# Patient Record
Sex: Female | Born: 1990
Health system: Southern US, Community
[De-identification: ages and names within clinical notes are randomized; demographics above are authoritative.]

## PROBLEM LIST (undated history)

## (undated) DIAGNOSIS — F988 Other specified behavioral and emotional disorders with onset usually occurring in childhood and adolescence: Secondary | ICD-10-CM

## (undated) DIAGNOSIS — F419 Anxiety disorder, unspecified: Secondary | ICD-10-CM

## (undated) DIAGNOSIS — O24419 Gestational diabetes mellitus in pregnancy, unspecified control: Secondary | ICD-10-CM

## (undated) DIAGNOSIS — J45990 Exercise induced bronchospasm: Secondary | ICD-10-CM

## (undated) HISTORY — DX: Exercise induced bronchospasm: J45.990

## (undated) HISTORY — DX: Other specified behavioral and emotional disorders with onset usually occurring in childhood and adolescence: F98.8

## (undated) HISTORY — DX: Gestational diabetes mellitus in pregnancy, unspecified control: O24.419

---
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
## 2015-03-04 LAB — HM PAP SMEAR: HM Pap smear: NORMAL

## 2016-01-21 ENCOUNTER — Ambulatory Visit (INDEPENDENT_AMBULATORY_CARE_PROVIDER_SITE_OTHER): Payer: 59 | Admitting: Internal Medicine

## 2016-01-21 ENCOUNTER — Encounter: Payer: Self-pay | Admitting: Internal Medicine

## 2016-01-21 ENCOUNTER — Encounter: Payer: Self-pay | Admitting: *Deleted

## 2016-01-21 VITALS — BP 108/66 | HR 71 | Temp 99.1°F | Ht 65.5 in | Wt 144.4 lb

## 2016-01-21 DIAGNOSIS — F909 Attention-deficit hyperactivity disorder, unspecified type: Secondary | ICD-10-CM | POA: Diagnosis not present

## 2016-01-21 DIAGNOSIS — J45990 Exercise induced bronchospasm: Secondary | ICD-10-CM | POA: Diagnosis not present

## 2016-01-21 DIAGNOSIS — F988 Other specified behavioral and emotional disorders with onset usually occurring in childhood and adolescence: Secondary | ICD-10-CM | POA: Insufficient documentation

## 2016-01-21 DIAGNOSIS — Z23 Encounter for immunization: Secondary | ICD-10-CM | POA: Diagnosis not present

## 2016-01-21 DIAGNOSIS — Z0000 Encounter for general adult medical examination without abnormal findings: Secondary | ICD-10-CM

## 2016-01-21 NOTE — Patient Instructions (Addendum)
Will notify you  of labs when available. If all well then  wellness visit in 2 years ( see your gyne yearly)   Or as needed. Sign up for my chart .  uis helpful for communications Second varicella  Injection today  Healthy lifestyle includes : At least 150 minutes of exercise weeks  , weight at healthy levels, which is usually   BMI 19-25. Avoid trans fats and processed foods;  Increase fresh fruits and veges to 5 servings per day. And avoid sweet beverages including tea and juice. Mediterranean diet with olive oil and nuts have been noted to be heart and brain healthy . Avoid tobacco products . Limit  alcohol to  7 per week for women and 14 servings for men.  Get adequate sleep . Wear seat belts . Don't text and drive .   Preventive Care for Adults, Female A healthy lifestyle and preventive care can promote health and wellness. Preventive health guidelines for women include the following key practices.  A routine yearly physical is a good way to check with your health care provider about your health and preventive screening. It is a chance to share any concerns and updates on your health and to receive a thorough exam.  Visit your dentist for a routine exam and preventive care every 6 months. Brush your teeth twice a day and floss once a day. Good oral hygiene prevents tooth decay and gum disease.  The frequency of eye exams is based on your age, health, family medical history, use of contact lenses, and other factors. Follow your health care provider's recommendations for frequency of eye exams.  Eat a healthy diet. Foods like vegetables, fruits, whole grains, low-fat dairy products, and lean protein foods contain the nutrients you need without too many calories. Decrease your intake of foods high in solid fats, added sugars, and salt. Eat the right amount of calories for you.Get information about a proper diet from your health care provider, if necessary.  Regular physical exercise is  one of the most important things you can do for your health. Most adults should get at least 150 minutes of moderate-intensity exercise (any activity that increases your heart rate and causes you to sweat) each week. In addition, most adults need muscle-strengthening exercises on 2 or more days a week.  Maintain a healthy weight. The body mass index (BMI) is a screening tool to identify possible weight problems. It provides an estimate of body fat based on height and weight. Your health care provider can find your BMI and can help you achieve or maintain a healthy weight.For adults 20 years and older:  A BMI below 18.5 is considered underweight.  A BMI of 18.5 to 24.9 is normal.  A BMI of 25 to 29.9 is considered overweight.  A BMI of 30 and above is considered obese.  Maintain normal blood lipids and cholesterol levels by exercising and minimizing your intake of saturated fat. Eat a balanced diet with plenty of fruit and vegetables. Blood tests for lipids and cholesterol should begin at age 33 and be repeated every 5 years. If your lipid or cholesterol levels are high, you are over 50, or you are at high risk for heart disease, you may need your cholesterol levels checked more frequently.Ongoing high lipid and cholesterol levels should be treated with medicines if diet and exercise are not working.  If you smoke, find out from your health care provider how to quit. If you do not use tobacco, do  not start.  Lung cancer screening is recommended for adults aged 66-80 years who are at high risk for developing lung cancer because of a history of smoking. A yearly low-dose CT scan of the lungs is recommended for people who have at least a 30-pack-year history of smoking and are a current smoker or have quit within the past 15 years. A pack year of smoking is smoking an average of 1 pack of cigarettes a day for 1 year (for example: 1 pack a day for 30 years or 2 packs a day for 15 years). Yearly  screening should continue until the smoker has stopped smoking for at least 15 years. Yearly screening should be stopped for people who develop a health problem that would prevent them from having lung cancer treatment.  If you are pregnant, do not drink alcohol. If you are breastfeeding, be very cautious about drinking alcohol. If you are not pregnant and choose to drink alcohol, do not have more than 1 drink per day. One drink is considered to be 12 ounces (355 mL) of beer, 5 ounces (148 mL) of wine, or 1.5 ounces (44 mL) of liquor.  Avoid use of street drugs. Do not share needles with anyone. Ask for help if you need support or instructions about stopping the use of drugs.  High blood pressure causes heart disease and increases the risk of stroke. Your blood pressure should be checked at least every 1 to 2 years. Ongoing high blood pressure should be treated with medicines if weight loss and exercise do not work.  If you are 2-48 years old, ask your health care provider if you should take aspirin to prevent strokes.  Diabetes screening is done by taking a blood sample to check your blood glucose level after you have not eaten for a certain period of time (fasting). If you are not overweight and you do not have risk factors for diabetes, you should be screened once every 3 years starting at age 1. If you are overweight or obese and you are 55-11 years of age, you should be screened for diabetes every year as part of your cardiovascular risk assessment.  Breast cancer screening is essential preventive care for women. You should practice "breast self-awareness." This means understanding the normal appearance and feel of your breasts and may include breast self-examination. Any changes detected, no matter how small, should be reported to a health care provider. Women in their 75s and 30s should have a clinical breast exam (CBE) by a health care provider as part of a regular health exam every 1 to 3  years. After age 29, women should have a CBE every year. Starting at age 29, women should consider having a mammogram (breast X-ray test) every year. Women who have a family history of breast cancer should talk to their health care provider about genetic screening. Women at a high risk of breast cancer should talk to their health care providers about having an MRI and a mammogram every year.  Breast cancer gene (BRCA)-related cancer risk assessment is recommended for women who have family members with BRCA-related cancers. BRCA-related cancers include breast, ovarian, tubal, and peritoneal cancers. Having family members with these cancers may be associated with an increased risk for harmful changes (mutations) in the breast cancer genes BRCA1 and BRCA2. Results of the assessment will determine the need for genetic counseling and BRCA1 and BRCA2 testing.  Your health care provider may recommend that you be screened regularly for cancer of the  pelvic organs (ovaries, uterus, and vagina). This screening involves a pelvic examination, including checking for microscopic changes to the surface of your cervix (Pap test). You may be encouraged to have this screening done every 3 years, beginning at age 60.  For women ages 82-65, health care providers may recommend pelvic exams and Pap testing every 3 years, or they may recommend the Pap and pelvic exam, combined with testing for human papilloma virus (HPV), every 5 years. Some types of HPV increase your risk of cervical cancer. Testing for HPV may also be done on women of any age with unclear Pap test results.  Other health care providers may not recommend any screening for nonpregnant women who are considered low risk for pelvic cancer and who do not have symptoms. Ask your health care provider if a screening pelvic exam is right for you.  If you have had past treatment for cervical cancer or a condition that could lead to cancer, you need Pap tests and screening  for cancer for at least 20 years after your treatment. If Pap tests have been discontinued, your risk factors (such as having a new sexual partner) need to be reassessed to determine if screening should resume. Some women have medical problems that increase the chance of getting cervical cancer. In these cases, your health care provider may recommend more frequent screening and Pap tests.  Colorectal cancer can be detected and often prevented. Most routine colorectal cancer screening begins at the age of 34 years and continues through age 34 years. However, your health care provider may recommend screening at an earlier age if you have risk factors for colon cancer. On a yearly basis, your health care provider may provide home test kits to check for hidden blood in the stool. Use of a small camera at the end of a tube, to directly examine the colon (sigmoidoscopy or colonoscopy), can detect the earliest forms of colorectal cancer. Talk to your health care provider about this at age 77, when routine screening begins. Direct exam of the colon should be repeated every 5-10 years through age 94 years, unless early forms of precancerous polyps or small growths are found.  People who are at an increased risk for hepatitis B should be screened for this virus. You are considered at high risk for hepatitis B if:  You were born in a country where hepatitis B occurs often. Talk with your health care provider about which countries are considered high risk.  Your parents were born in a high-risk country and you have not received a shot to protect against hepatitis B (hepatitis B vaccine).  You have HIV or AIDS.  You use needles to inject street drugs.  You live with, or have sex with, someone who has hepatitis B.  You get hemodialysis treatment.  You take certain medicines for conditions like cancer, organ transplantation, and autoimmune conditions.  Hepatitis C blood testing is recommended for all people  born from [birth year] through 1964 and any individual with known risks for hepatitis C.  Practice safe sex. Use condoms and avoid high-risk sexual practices to reduce the spread of sexually transmitted infections (STIs). STIs include gonorrhea, chlamydia, syphilis, trichomonas, herpes, HPV, and human immunodeficiency virus (HIV). Herpes, HIV, and HPV are viral illnesses that have no cure. They can result in disability, cancer, and death.  You should be screened for sexually transmitted illnesses (STIs) including gonorrhea and chlamydia if:  You are sexually active and are younger than 24 years.  You are  older than 24 years and your health care provider tells you that you are at risk for this type of infection.  Your sexual activity has changed since you were last screened and you are at an increased risk for chlamydia or gonorrhea. Ask your health care provider if you are at risk.  If you are at risk of being infected with HIV, it is recommended that you take a prescription medicine daily to prevent HIV infection. This is called preexposure prophylaxis (PrEP). You are considered at risk if:  You are sexually active and do not regularly use condoms or know the HIV status of your partner(s).  You take drugs by injection.  You are sexually active with a partner who has HIV.  Talk with your health care provider about whether you are at high risk of being infected with HIV. If you choose to begin PrEP, you should first be tested for HIV. You should then be tested every 3 months for as long as you are taking PrEP.  Osteoporosis is a disease in which the bones lose minerals and strength with aging. This can result in serious bone fractures or breaks. The risk of osteoporosis can be identified using a bone density scan. Women ages 61 years and over and women at risk for fractures or osteoporosis should discuss screening with their health care providers. Ask your health care provider whether you should take a  calcium supplement or vitamin D to reduce the rate of osteoporosis.  Menopause can be associated with physical symptoms and risks. Hormone replacement therapy is available to decrease symptoms and risks. You should talk to your health care provider about whether hormone replacement therapy is right for you.  Use sunscreen. Apply sunscreen liberally and repeatedly throughout the day. You should seek shade when your shadow is shorter than you. Protect yourself by wearing long sleeves, pants, a wide-brimmed hat, and sunglasses year round, whenever you are outdoors.  Once a month, do a whole body skin exam, using a mirror to look at the skin on your back. Tell your health care provider of new moles, moles that have irregular borders, moles that are larger than a pencil eraser, or moles that have changed in shape or color.  Stay current with required vaccines (immunizations).  Influenza vaccine. All adults should be immunized every year.  Tetanus, diphtheria, and acellular pertussis (Td, Tdap) vaccine. Pregnant women should receive 1 dose of Tdap vaccine during each pregnancy. The dose should be obtained regardless of the length of time since the last dose. Immunization is preferred during the 27th-36th week of gestation. An adult who has not previously received Tdap or who does not know her vaccine status should receive 1 dose of Tdap. This initial dose should be followed by tetanus and diphtheria toxoids (Td) booster doses every 10 years. Adults with an unknown or incomplete history of completing a 3-dose immunization series with Td-containing vaccines should begin or complete a primary immunization series including a Tdap dose. Adults should receive a Td booster every 10 years.  Varicella vaccine. An adult without evidence of immunity to varicella should receive 2 doses or a second dose if she has previously received 1 dose. Pregnant females who do not have evidence of immunity should receive the first  dose after pregnancy. This first dose should be obtained before leaving the health care facility. The second dose should be obtained 4-8 weeks after the first dose.  Human papillomavirus (HPV) vaccine. Females aged 13-26 years who have not received  the vaccine previously should obtain the 3-dose series. The vaccine is not recommended for use in pregnant females. However, pregnancy testing is not needed before receiving a dose. If a female is found to be pregnant after receiving a dose, no treatment is needed. In that case, the remaining doses should be delayed until after the pregnancy. Immunization is recommended for any person with an immunocompromised condition through the age of 46 years if she did not get any or all doses earlier. During the 3-dose series, the second dose should be obtained 4-8 weeks after the first dose. The third dose should be obtained 24 weeks after the first dose and 16 weeks after the second dose.  Zoster vaccine. One dose is recommended for adults aged 18 years or older unless certain conditions are present.  Measles, mumps, and rubella (MMR) vaccine. Adults born before [birth year] generally are considered immune to measles and mumps. Adults born in [birth year] or later should have 1 or more doses of MMR vaccine unless there is a contraindication to the vaccine or there is laboratory evidence of immunity to each of the three diseases. A routine second dose of MMR vaccine should be obtained at least 28 days after the first dose for students attending postsecondary schools, health care workers, or international travelers. People who received inactivated measles vaccine or an unknown type of measles vaccine during 1963-1967 should receive 2 doses of MMR vaccine. People who received inactivated mumps vaccine or an unknown type of mumps vaccine before 1979 and are at high risk for mumps infection should consider immunization with 2 doses of MMR vaccine. For females of childbearing age, rubella  immunity should be determined. If there is no evidence of immunity, females who are not pregnant should be vaccinated. If there is no evidence of immunity, females who are pregnant should delay immunization until after pregnancy. Unvaccinated health care workers born before 65 who lack laboratory evidence of measles, mumps, or rubella immunity or laboratory confirmation of disease should consider measles and mumps immunization with 2 doses of MMR vaccine or rubella immunization with 1 dose of MMR vaccine.  Pneumococcal 13-valent conjugate (PCV13) vaccine. When indicated, a person who is uncertain of his immunization history and has no record of immunization should receive the PCV13 vaccine. All adults 73 years of age and older should receive this vaccine. An adult aged 64 years or older who has certain medical conditions and has not been previously immunized should receive 1 dose of PCV13 vaccine. This PCV13 should be followed with a dose of pneumococcal polysaccharide (PPSV23) vaccine. Adults who are at high risk for pneumococcal disease should obtain the PPSV23 vaccine at least 8 weeks after the dose of PCV13 vaccine. Adults older than 24 years of age who have normal immune system function should obtain the PPSV23 vaccine dose at least 1 year after the dose of PCV13 vaccine.  Pneumococcal polysaccharide (PPSV23) vaccine. When PCV13 is also indicated, PCV13 should be obtained first. All adults aged 29 years and older should be immunized. An adult younger than age 70 years who has certain medical conditions should be immunized. Any person who resides in a nursing home or long-term care facility should be immunized. An adult smoker should be immunized. People with an immunocompromised condition and certain other conditions should receive both PCV13 and PPSV23 vaccines. People with human immunodeficiency virus (HIV) infection should be immunized as soon as possible after diagnosis. Immunization during  chemotherapy or radiation therapy should be avoided. Routine use of PPSV23 vaccine  is not recommended for American Indians, [place] Natives, or people younger than 27 years unless there are medical conditions that require PPSV23 vaccine. When indicated, people who have unknown immunization and have no record of immunization should receive PPSV23 vaccine. One-time revaccination 5 years after the first dose of PPSV23 is recommended for people aged 19-64 years who have chronic kidney failure, nephrotic syndrome, asplenia, or immunocompromised conditions. People who received 1-2 doses of PPSV23 before age 50 years should receive another dose of PPSV23 vaccine at age 6 years or later if at least 5 years have passed since the previous dose. Doses of PPSV23 are not needed for people immunized with PPSV23 at or after age 40 years.  Meningococcal vaccine. Adults with asplenia or persistent complement component deficiencies should receive 2 doses of quadrivalent meningococcal conjugate (MenACWY-D) vaccine. The doses should be obtained at least 2 months apart. Microbiologists working with certain meningococcal bacteria, [place] recruits, people at risk during an outbreak, and people who travel to or live in countries with a high rate of meningitis should be immunized. A first-year college student up through age 67 years who is living in a residence hall should receive a dose if she did not receive a dose on or after her 16th birthday. Adults who have certain high-risk conditions should receive one or more doses of vaccine.  Hepatitis A vaccine. Adults who wish to be protected from this disease, have certain high-risk conditions, work with hepatitis A-infected animals, work in hepatitis A research labs, or travel to or work in countries with a high rate of hepatitis A should be immunized. Adults who were previously unvaccinated and who anticipate close contact with an international adoptee during the first 60 days after  arrival in the [place] from a country with a high rate of hepatitis A should be immunized.  Hepatitis B vaccine. Adults who wish to be protected from this disease, have certain high-risk conditions, may be exposed to blood or other infectious body fluids, are household contacts or sex partners of hepatitis B positive people, are clients or workers in certain care facilities, or travel to or work in countries with a high rate of hepatitis B should be immunized.  Haemophilus influenzae type b (Hib) vaccine. A previously unvaccinated person with asplenia or sickle cell disease or having a scheduled splenectomy should receive 1 dose of Hib vaccine. Regardless of previous immunization, a recipient of a hematopoietic stem cell transplant should receive a 3-dose series 6-12 months after her successful transplant. Hib vaccine is not recommended for adults with HIV infection. Preventive Services / Frequency Ages 44 to 37 years  Blood pressure check.** / Every 3-5 years.  Lipid and cholesterol check.** / Every 5 years beginning at age 77.  Clinical breast exam.** / Every 3 years for women in their 44s and 80s.  BRCA-related cancer risk assessment.** / For women who have family members with a BRCA-related cancer (breast, ovarian, tubal, or peritoneal cancers).  Pap test.** / Every 2 years from ages 65 through 51. Every 3 years starting at age 68 through age 61 or 29 with a history of 3 consecutive normal Pap tests.  HPV screening.** / Every 3 years from ages 4 through ages 42 to 43 with a history of 3 consecutive normal Pap tests.  Hepatitis C blood test.** / For any individual with known risks for hepatitis C.  Skin self-exam. / Monthly.  Influenza vaccine. / Every year.  Tetanus, diphtheria, and acellular pertussis (Tdap, Td) vaccine.** / Consult  your health care provider. Pregnant women should receive 1 dose of Tdap vaccine during each pregnancy. 1 dose of Td every 10 years.  Varicella  vaccine.** / Consult your health care provider. Pregnant females who do not have evidence of immunity should receive the first dose after pregnancy.  HPV vaccine. / 3 doses over 6 months, if 32 and younger. The vaccine is not recommended for use in pregnant females. However, pregnancy testing is not needed before receiving a dose.  Measles, mumps, rubella (MMR) vaccine.** / You need at least 1 dose of MMR if you were born in 1957 or later. You may also need a 2nd dose. For females of childbearing age, rubella immunity should be determined. If there is no evidence of immunity, females who are not pregnant should be vaccinated. If there is no evidence of immunity, females who are pregnant should delay immunization until after pregnancy.  Pneumococcal 13-valent conjugate (PCV13) vaccine.** / Consult your health care provider.  Pneumococcal polysaccharide (PPSV23) vaccine.** / 1 to 2 doses if you smoke cigarettes or if you have certain conditions.  Meningococcal vaccine.** / 1 dose if you are age 74 to 25 years and a Market researcher living in a residence hall, or have one of several medical conditions, you need to get vaccinated against meningococcal disease. You may also need additional booster doses.  Hepatitis A vaccine.** / Consult your health care provider.  Hepatitis B vaccine.** / Consult your health care provider.  Haemophilus influenzae type b (Hib) vaccine.** / Consult your health care provider. Ages 91 to 18 years  Blood pressure check.** / Every year.  Lipid and cholesterol check.** / Every 5 years beginning at age 75 years.  Lung cancer screening. / Every year if you are aged 2-80 years and have a 30-pack-year history of smoking and currently smoke or have quit within the past 15 years. Yearly screening is stopped once you have quit smoking for at least 15 years or develop a health problem that would prevent you from having lung cancer treatment.  Clinical breast exam.**  / Every year after age 70 years.  BRCA-related cancer risk assessment.** / For women who have family members with a BRCA-related cancer (breast, ovarian, tubal, or peritoneal cancers).  Mammogram.** / Every year beginning at age 63 years and continuing for as long as you are in good health. Consult with your health care provider.  Pap test.** / Every 3 years starting at age 45 years through age 69 or 27 years with a history of 3 consecutive normal Pap tests.  HPV screening.** / Every 3 years from ages 69 years through ages 55 to 109 years with a history of 3 consecutive normal Pap tests.  Fecal occult blood test (FOBT) of stool. / Every year beginning at age 87 years and continuing until age 65 years. You may not need to do this test if you get a colonoscopy every 10 years.  Flexible sigmoidoscopy or colonoscopy.** / Every 5 years for a flexible sigmoidoscopy or every 10 years for a colonoscopy beginning at age 34 years and continuing until age 58 years.  Hepatitis C blood test.** / For all people born from [birth year] through 1964 and any individual with known risks for hepatitis C.  Skin self-exam. / Monthly.  Influenza vaccine. / Every year.  Tetanus, diphtheria, and acellular pertussis (Tdap/Td) vaccine.** / Consult your health care provider. Pregnant women should receive 1 dose of Tdap vaccine during each pregnancy. 1 dose of Td every 10 years.  Varicella vaccine.** / Consult your health care provider. Pregnant females who do not have evidence of immunity should receive the first dose after pregnancy.  Zoster vaccine.** / 1 dose for adults aged 68 years or older.  Measles, mumps, rubella (MMR) vaccine.** / You need at least 1 dose of MMR if you were born in 1957 or later. You may also need a second dose. For females of childbearing age, rubella immunity should be determined. If there is no evidence of immunity, females who are not pregnant should be vaccinated. If there is no evidence of  immunity, females who are pregnant should delay immunization until after pregnancy.  Pneumococcal 13-valent conjugate (PCV13) vaccine.** / Consult your health care provider.  Pneumococcal polysaccharide (PPSV23) vaccine.** / 1 to 2 doses if you smoke cigarettes or if you have certain conditions.  Meningococcal vaccine.** / Consult your health care provider.  Hepatitis A vaccine.** / Consult your health care provider.  Hepatitis B vaccine.** / Consult your health care provider.  Haemophilus influenzae type b (Hib) vaccine.** / Consult your health care provider. Ages 16 years and over  Blood pressure check.** / Every year.  Lipid and cholesterol check.** / Every 5 years beginning at age 50 years.  Lung cancer screening. / Every year if you are aged 10-80 years and have a 30-pack-year history of smoking and currently smoke or have quit within the past 15 years. Yearly screening is stopped once you have quit smoking for at least 15 years or develop a health problem that would prevent you from having lung cancer treatment.  Clinical breast exam.** / Every year after age 61 years.  BRCA-related cancer risk assessment.** / For women who have family members with a BRCA-related cancer (breast, ovarian, tubal, or peritoneal cancers).  Mammogram.** / Every year beginning at age 91 years and continuing for as long as you are in good health. Consult with your health care provider.  Pap test.** / Every 3 years starting at age 38 years through age 24 or 37 years with 3 consecutive normal Pap tests. Testing can be stopped between 65 and 70 years with 3 consecutive normal Pap tests and no abnormal Pap or HPV tests in the past 10 years.  HPV screening.** / Every 3 years from ages 65 years through ages 81 or 69 years with a history of 3 consecutive normal Pap tests. Testing can be stopped between 65 and 70 years with 3 consecutive normal Pap tests and no abnormal Pap or HPV tests in the past 10  years.  Fecal occult blood test (FOBT) of stool. / Every year beginning at age 29 years and continuing until age 27 years. You may not need to do this test if you get a colonoscopy every 10 years.  Flexible sigmoidoscopy or colonoscopy.** / Every 5 years for a flexible sigmoidoscopy or every 10 years for a colonoscopy beginning at age 61 years and continuing until age 76 years.  Hepatitis C blood test.** / For all people born from [birth year] through 1964 and any individual with known risks for hepatitis C.  Osteoporosis screening.** / A one-time screening for women ages 50 years and over and women at risk for fractures or osteoporosis.  Skin self-exam. / Monthly.  Influenza vaccine. / Every year.  Tetanus, diphtheria, and acellular pertussis (Tdap/Td) vaccine.** / 1 dose of Td every 10 years.  Varicella vaccine.** / Consult your health care provider.  Zoster vaccine.** / 1 dose for adults aged 6 years or older.  Pneumococcal 13-valent  conjugate (PCV13) vaccine.** / Consult your health care provider.  Pneumococcal polysaccharide (PPSV23) vaccine.** / 1 dose for all adults aged 50 years and older.  Meningococcal vaccine.** / Consult your health care provider.  Hepatitis A vaccine.** / Consult your health care provider.  Hepatitis B vaccine.** / Consult your health care provider.  Haemophilus influenzae type b (Hib) vaccine.** / Consult your health care provider. ** Family history and personal history of risk and conditions may change your health care provider's recommendations.   This information is not intended to replace advice given to you by your health care provider. Make sure you discuss any questions you have with your health care provider.   Document Released: 10/12/2001 Document Revised: 09/06/2014 Document Reviewed: 01/11/2011 [employer] Interactive Patient Education [employer].

## 2016-01-21 NOTE — Progress Notes (Signed)
Chief Complaint  Patient presents with  . Establish Care    HPI: Patient  Bailey Reyes  25 y.o. 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 Medication caused migraines so stopped  And now  Tried to attend with non med strategies . Has a gyne and check  Needs pcp  No ongoing issues other wise  BF is in our practice  Has had sti hiv check with [employer] insurance and no feeling at risk    There are no preventive care reminders to display for this patient. Health Maintenance Review LIFESTYLE:  Exercise:  mountain bike Tobacco/ETS:n Alcohol: pocass Sugar beverages:n Sleep:6-8 hours  Drug use: no hh of 2 bf no pets  G0P0 pap '7 5 16  ' ROS:  GEN/ HEENT: No fever, significant weight changes sweats headaches vision problems hearing changes, CV/ PULM; No chest pain shortness of breath cough, syncope,edema  change in exercise tolerance. GI /GU: No adominal pain, vomiting, change in bowel habits. No blood in the stool. No significant GU symptoms. SKIN/HEME: ,no acute skin rashes suspicious lesions or bleeding. No lymphadenopathy, nodules, masses.  NEURO/ PSYCH:  No neurologic signs such as weakness numbness. No depression anxiety. IMM/ Allergy: No unusual infections.  Allergy .   REST of 12 system review negative except as per HPI   Past Medical History  Diagnosis Date  . Exercise-induced asthma     emprici dx  given  inhaler prn no current use  . ADD (attention deficit disorder)     dx in college  sx since grade school had ha with med      Past Surgical History  Procedure Laterality Date  . Tonsillectomy and adenoidectomy  2011    Family History  Problem Relation Age of Onset  . Rheum arthritis Maternal Grandmother     on     Social History   Social History  . Marital Status: Single    Spouse Name: N/A    . Number of Children: N/A  . Years of Education: N/A   Social History Main Topics  . Smoking status: Never Smoker   . Smokeless tobacco: None  . Alcohol Use: 0.0 oz/week    0 Standard drinks or equivalent per week     Comment: average 2 drinks weekly  . Drug Use: No  . Sexual Activity: Not Asked   Other Topics Concern  . None   Social History Narrative   From [place] lives with bf   [employer] graduate    Client associ  Employed    Raised by gm since age 43 years   Dx adhd college    G0P0    No outpatient prescriptions prior to visit.   No facility-administered medications prior to visit.     EXAM:  BP 108/66 mmHg  Pulse 71  Temp(Src) 99.1 F (37.3 C) (Oral)  Ht 5' 5.5" (1.664 m)  Wt 144 lb 7 oz (65.516 kg)  BMI 23.66 kg/m2  SpO2 98%  LMP 01/07/2016 (Exact Date)  Body mass index is 23.66 kg/(m^2).  Physical Exam: Vital signs reviewed UKG:URKY is a well-developed well-nourished alert cooperative    who appearsr stated age in no acute distress.  HEENT: normocephalic atraumatic , Eyes: PERRL EOM's full, conjunctiva clear, Nares: paten,t no deformity discharge or tenderness., Ears: no deformity  EAC's clear TMs with normal landmarks. Mouth: clear OP, no lesions, edema.  Moist mucous membranes. Dentition in adequate repair. NECK: supple without masses, thyromegaly or bruits. CHEST/PULM:  Clear to auscultation and percussion breath sounds equal no wheeze , rales or rhonchi. No chest wall deformities or tenderness.Breast: normal by inspection . No dimpling, discharge, masses, tenderness or discharge . CV: PMI is nondisplaced, S1 S2 no gallops, murmurs, rubs. Peripheral pulses are full without delay.No JVD .  ABDOMEN: Bowel sounds normal nontender  No guard or rebound, no hepato splenomegal no CVA tenderness.  No hernia. Extremtities:  No clubbing cyanosis or edema, no acute joint swelling or redness no focal atrophy NEURO:  Oriented x3, cranial nerves 3-12 appear to be intact,  no obvious focal weakness,gait within normal limits no abnormal reflexes or asymmetrical SKIN: No acute rashes normal turgor, color, no bruising or petechiae. PSYCH: Oriented, good eye contact, no obvious depression anxiety, cognition and judgment appear normal. Tends to be fidgity   And excess motor activity ,non focal exam looks well  LN: no cervical axillary inguinal adenopathy  No results found for: WBC, HGB, HCT, PLT, GLUCOSE, CHOL, TRIG, HDL, LDLDIRECT, LDLCALC, ALT, AST, NA, K, CL, CREATININE, BUN, CO2, TSH, PSA, INR, GLUF, HGBA1C, MICROALBUR  ASSESSMENT AND PLAN:  Discussed the following assessment and plan:  Visit for preventive health examination - immuniz review consider hep a vaccinelab today counseled  wellness in 2 years or as needed if see gyne check  - Plan: Basic metabolic panel, CBC with Differential/Platelet, Hepatic function panel, TSH, Lipid panel, CANCELED: Basic metabolic panel, CANCELED: CBC with Differential/Platelet, CANCELED: Hepatic function panel, CANCELED: Lipid panel, CANCELED: TSH  Need for varicella vaccine - Plan: Varicella vaccine subcutaneous  ADD (attention deficit disorder)  Exercise-induced asthma  Patient Care Team: Burnis Medin, MD as PCP - General (Internal Medicine) Patient Instructions  Will notify you  of labs when available. If all well then  wellness visit in 2 years ( see your gyne yearly)   Or as needed. Sign up for my chart .  uis helpful for communications Second varicella  Injection today  Healthy lifestyle includes : At least 150 minutes of exercise weeks  , weight at healthy levels, which is usually   BMI 19-25. Avoid trans fats and processed foods;  Increase fresh fruits and veges to 5 servings per day. And avoid sweet beverages including tea and juice. Mediterranean diet with olive oil and nuts have been noted to be heart and brain healthy . Avoid tobacco products . Limit  alcohol to  7 per week for women and 14 servings for  men.  Get adequate sleep . Wear seat belts . Don't text and drive .   Preventive Care for Adults, Female A healthy lifestyle and preventive care can promote health and wellness. Preventive health guidelines for women include the following key practices.  A routine yearly physical is a good way to check with your health care provider about your health and preventive screening. It is a chance to share any concerns and updates on your health and to receive a thorough exam.  Visit your dentist for a routine exam and preventive care every 6 months. Brush your teeth twice a day and floss once a day. Good oral hygiene prevents tooth decay and gum disease.  The frequency of eye exams is based on your age, health, family medical history, use of contact lenses, and other factors. Follow your health care provider's recommendations for frequency of eye  exams.  Eat a healthy diet. Foods like vegetables, fruits, whole grains, low-fat dairy products, and lean protein foods contain the nutrients you need without too many calories. Decrease your intake of foods high in solid fats, added sugars, and salt. Eat the right amount of calories for you.Get information about a proper diet from your health care provider, if necessary.  Regular physical exercise is one of the most important things you can do for your health. Most adults should get at least 150 minutes of moderate-intensity exercise (any activity that increases your heart rate and causes you to sweat) each week. In addition, most adults need muscle-strengthening exercises on 2 or more days a week.  Maintain a healthy weight. The body mass index (BMI) is a screening tool to identify possible weight problems. It provides an estimate of body fat based on height and weight. Your health care provider can find your BMI and can help you achieve or maintain a healthy weight.For adults 20 years and older:  A BMI below 18.5 is considered underweight.  A BMI of  18.5 to 24.9 is normal.  A BMI of 25 to 29.9 is considered overweight.  A BMI of 30 and above is considered obese.  Maintain normal blood lipids and cholesterol levels by exercising and minimizing your intake of saturated fat. Eat a balanced diet with plenty of fruit and vegetables. Blood tests for lipids and cholesterol should begin at age 55 and be repeated every 5 years. If your lipid or cholesterol levels are high, you are over 50, or you are at high risk for heart disease, you may need your cholesterol levels checked more frequently.Ongoing high lipid and cholesterol levels should be treated with medicines if diet and exercise are not working.  If you smoke, find out from your health care provider how to quit. If you do not use tobacco, do not start.  Lung cancer screening is recommended for adults aged 70-80 years who are at high risk for developing lung cancer because of a history of smoking. A yearly low-dose CT scan of the lungs is recommended for people who have at least a 30-pack-year history of smoking and are a current smoker or have quit within the past 15 years. A pack year of smoking is smoking an average of 1 pack of cigarettes a day for 1 year (for example: 1 pack a day for 30 years or 2 packs a day for 15 years). Yearly screening should continue until the smoker has stopped smoking for at least 15 years. Yearly screening should be stopped for people who develop a health problem that would prevent them from having lung cancer treatment.  If you are pregnant, do not drink alcohol. If you are breastfeeding, be very cautious about drinking alcohol. If you are not pregnant and choose to drink alcohol, do not have more than 1 drink per day. One drink is considered to be 12 ounces (355 mL) of beer, 5 ounces (148 mL) of wine, or 1.5 ounces (44 mL) of liquor.  Avoid use of street drugs. Do not share needles with anyone. Ask for help if you need support or instructions about stopping the use  of drugs.  High blood pressure causes heart disease and increases the risk of stroke. Your blood pressure should be checked at least every 1 to 2 years. Ongoing high blood pressure should be treated with medicines if weight loss and exercise do not work.  If you are 70-24 years old, ask your health care  provider if you should take aspirin to prevent strokes.  Diabetes screening is done by taking a blood sample to check your blood glucose level after you have not eaten for a certain period of time (fasting). If you are not overweight and you do not have risk factors for diabetes, you should be screened once every 3 years starting at age 68. If you are overweight or obese and you are 59-14 years of age, you should be screened for diabetes every year as part of your cardiovascular risk assessment.  Breast cancer screening is essential preventive care for women. You should practice "breast self-awareness." This means understanding the normal appearance and feel of your breasts and may include breast self-examination. Any changes detected, no matter how small, should be reported to a health care provider. Women in their 12s and 30s should have a clinical breast exam (CBE) by a health care provider as part of a regular health exam every 1 to 3 years. After age 44, women should have a CBE every year. Starting at age 58, women should consider having a mammogram (breast X-ray test) every year. Women who have a family history of breast cancer should talk to their health care provider about genetic screening. Women at a high risk of breast cancer should talk to their health care providers about having an MRI and a mammogram every year.  Breast cancer gene (BRCA)-related cancer risk assessment is recommended for women who have family members with BRCA-related cancers. BRCA-related cancers include breast, ovarian, tubal, and peritoneal cancers. Having family members with these cancers may be associated with an  increased risk for harmful changes (mutations) in the breast cancer genes BRCA1 and BRCA2. Results of the assessment will determine the need for genetic counseling and BRCA1 and BRCA2 testing.  Your health care provider may recommend that you be screened regularly for cancer of the pelvic organs (ovaries, uterus, and vagina). This screening involves a pelvic examination, including checking for microscopic changes to the surface of your cervix (Pap test). You may be encouraged to have this screening done every 3 years, beginning at age 42.  For women ages 12-65, health care providers may recommend pelvic exams and Pap testing every 3 years, or they may recommend the Pap and pelvic exam, combined with testing for human papilloma virus (HPV), every 5 years. Some types of HPV increase your risk of cervical cancer. Testing for HPV may also be done on women of any age with unclear Pap test results.  Other health care providers may not recommend any screening for nonpregnant women who are considered low risk for pelvic cancer and who do not have symptoms. Ask your health care provider if a screening pelvic exam is right for you.  If you have had past treatment for cervical cancer or a condition that could lead to cancer, you need Pap tests and screening for cancer for at least 20 years after your treatment. If Pap tests have been discontinued, your risk factors (such as having a new sexual partner) need to be reassessed to determine if screening should resume. Some women have medical problems that increase the chance of getting cervical cancer. In these cases, your health care provider may recommend more frequent screening and Pap tests.  Colorectal cancer can be detected and often prevented. Most routine colorectal cancer screening begins at the age of 68 years and continues through age 70 years. However, your health care provider may recommend screening at an earlier age if you have risk factors  for colon  cancer. On a yearly basis, your health care provider may provide home test kits to check for hidden blood in the stool. Use of a small camera at the end of a tube, to directly examine the colon (sigmoidoscopy or colonoscopy), can detect the earliest forms of colorectal cancer. Talk to your health care provider about this at age 67, when routine screening begins. Direct exam of the colon should be repeated every 5-10 years through age 74 years, unless early forms of precancerous polyps or small growths are found.  People who are at an increased risk for hepatitis B should be screened for this virus. You are considered at high risk for hepatitis B if:  You were born in a country where hepatitis B occurs often. Talk with your health care provider about which countries are considered high risk.  Your parents were born in a high-risk country and you have not received a shot to protect against hepatitis B (hepatitis B vaccine).  You have HIV or AIDS.  You use needles to inject street drugs.  You live with, or have sex with, someone who has hepatitis B.  You get hemodialysis treatment.  You take certain medicines for conditions like cancer, organ transplantation, and autoimmune conditions.  Hepatitis C blood testing is recommended for all people born from [birth year] through 1965 and any individual with known risks for hepatitis C.  Practice safe sex. Use condoms and avoid high-risk sexual practices to reduce the spread of sexually transmitted infections (STIs). STIs include gonorrhea, chlamydia, syphilis, trichomonas, herpes, HPV, and human immunodeficiency virus (HIV). Herpes, HIV, and HPV are viral illnesses that have no cure. They can result in disability, cancer, and death.  You should be screened for sexually transmitted illnesses (STIs) including gonorrhea and chlamydia if:  You are sexually active and are younger than 24 years.  You are older than 24 years and your health care provider tells you  that you are at risk for this type of infection.  Your sexual activity has changed since you were last screened and you are at an increased risk for chlamydia or gonorrhea. Ask your health care provider if you are at risk.  If you are at risk of being infected with HIV, it is recommended that you take a prescription medicine daily to prevent HIV infection. This is called preexposure prophylaxis (PrEP). You are considered at risk if:  You are sexually active and do not regularly use condoms or know the HIV status of your partner(s).  You take drugs by injection.  You are sexually active with a partner who has HIV.  Talk with your health care provider about whether you are at high risk of being infected with HIV. If you choose to begin PrEP, you should first be tested for HIV. You should then be tested every 3 months for as long as you are taking PrEP.  Osteoporosis is a disease in which the bones lose minerals and strength with aging. This can result in serious bone fractures or breaks. The risk of osteoporosis can be identified using a bone density scan. Women ages 16 years and over and women at risk for fractures or osteoporosis should discuss screening with their health care providers. Ask your health care provider whether you should take a calcium supplement or vitamin D to reduce the rate of osteoporosis.  Menopause can be associated with physical symptoms and risks. Hormone replacement therapy is available to decrease symptoms and risks. You should talk to your health  care provider about whether hormone replacement therapy is right for you.  Use sunscreen. Apply sunscreen liberally and repeatedly throughout the day. You should seek shade when your shadow is shorter than you. Protect yourself by wearing long sleeves, pants, a wide-brimmed hat, and sunglasses year round, whenever you are outdoors.  Once a month, do a whole body skin exam, using a mirror to look at the skin on your back. Tell  your health care provider of new moles, moles that have irregular borders, moles that are larger than a pencil eraser, or moles that have changed in shape or color.  Stay current with required vaccines (immunizations).  Influenza vaccine. All adults should be immunized every year.  Tetanus, diphtheria, and acellular pertussis (Td, Tdap) vaccine. Pregnant women should receive 1 dose of Tdap vaccine during each pregnancy. The dose should be obtained regardless of the length of time since the last dose. Immunization is preferred during the 27th-36th week of gestation. An adult who has not previously received Tdap or who does not know her vaccine status should receive 1 dose of Tdap. This initial dose should be followed by tetanus and diphtheria toxoids (Td) booster doses every 10 years. Adults with an unknown or incomplete history of completing a 3-dose immunization series with Td-containing vaccines should begin or complete a primary immunization series including a Tdap dose. Adults should receive a Td booster every 10 years.  Varicella vaccine. An adult without evidence of immunity to varicella should receive 2 doses or a second dose if she has previously received 1 dose. Pregnant females who do not have evidence of immunity should receive the first dose after pregnancy. This first dose should be obtained before leaving the health care facility. The second dose should be obtained 4-8 weeks after the first dose.  Human papillomavirus (HPV) vaccine. Females aged 13-26 years who have not received the vaccine previously should obtain the 3-dose series. The vaccine is not recommended for use in pregnant females. However, pregnancy testing is not needed before receiving a dose. If a female is found to be pregnant after receiving a dose, no treatment is needed. In that case, the remaining doses should be delayed until after the pregnancy. Immunization is recommended for any person with an immunocompromised  condition through the age of 60 years if she did not get any or all doses earlier. During the 3-dose series, the second dose should be obtained 4-8 weeks after the first dose. The third dose should be obtained 24 weeks after the first dose and 16 weeks after the second dose.  Zoster vaccine. One dose is recommended for adults aged 11 years or older unless certain conditions are present.  Measles, mumps, and rubella (MMR) vaccine. Adults born before [birth year] generally are considered immune to measles and mumps. Adults born in [birth year] or later should have 1 or more doses of MMR vaccine unless there is a contraindication to the vaccine or there is laboratory evidence of immunity to each of the three diseases. A routine second dose of MMR vaccine should be obtained at least 28 days after the first dose for students attending postsecondary schools, health care workers, or international travelers. People who received inactivated measles vaccine or an unknown type of measles vaccine during 1963-1967 should receive 2 doses of MMR vaccine. People who received inactivated mumps vaccine or an unknown type of mumps vaccine before 1979 and are at high risk for mumps infection should consider immunization with 2 doses of MMR vaccine. For females of childbearing  age, rubella immunity should be determined. If there is no evidence of immunity, females who are not pregnant should be vaccinated. If there is no evidence of immunity, females who are pregnant should delay immunization until after pregnancy. Unvaccinated health care workers born before [birth year] who lack laboratory evidence of measles, mumps, or rubella immunity or laboratory confirmation of disease should consider measles and mumps immunization with 2 doses of MMR vaccine or rubella immunization with 1 dose of MMR vaccine.  Pneumococcal 13-valent conjugate (PCV13) vaccine. When indicated, a person who is uncertain of his immunization history and has no record of immunization  should receive the PCV13 vaccine. All adults 92 years of age and older should receive this vaccine. An adult aged 22 years or older who has certain medical conditions and has not been previously immunized should receive 1 dose of PCV13 vaccine. This PCV13 should be followed with a dose of pneumococcal polysaccharide (PPSV23) vaccine. Adults who are at high risk for pneumococcal disease should obtain the PPSV23 vaccine at least 8 weeks after the dose of PCV13 vaccine. Adults older than 24 years of age who have normal immune system function should obtain the PPSV23 vaccine dose at least 1 year after the dose of PCV13 vaccine.  Pneumococcal polysaccharide (PPSV23) vaccine. When PCV13 is also indicated, PCV13 should be obtained first. All adults aged 64 years and older should be immunized. An adult younger than age 44 years who has certain medical conditions should be immunized. Any person who resides in a nursing home or long-term care facility should be immunized. An adult smoker should be immunized. People with an immunocompromised condition and certain other conditions should receive both PCV13 and PPSV23 vaccines. People with human immunodeficiency virus (HIV) infection should be immunized as soon as possible after diagnosis. Immunization during chemotherapy or radiation therapy should be avoided. Routine use of PPSV23 vaccine is not recommended for American Indians, [place] Natives, or people younger than 65 years unless there are medical conditions that require PPSV23 vaccine. When indicated, people who have unknown immunization and have no record of immunization should receive PPSV23 vaccine. One-time revaccination 5 years after the first dose of PPSV23 is recommended for people aged 19-64 years who have chronic kidney failure, nephrotic syndrome, asplenia, or immunocompromised conditions. People who received 1-2 doses of PPSV23 before age 47 years should receive another dose of PPSV23 vaccine at age 19 years  or later if at least 5 years have passed since the previous dose. Doses of PPSV23 are not needed for people immunized with PPSV23 at or after age 98 years.  Meningococcal vaccine. Adults with asplenia or persistent complement component deficiencies should receive 2 doses of quadrivalent meningococcal conjugate (MenACWY-D) vaccine. The doses should be obtained at least 2 months apart. Microbiologists working with certain meningococcal bacteria, [place] recruits, people at risk during an outbreak, and people who travel to or live in countries with a high rate of meningitis should be immunized. A first-year college student up through age 8 years who is living in a residence hall should receive a dose if she did not receive a dose on or after her 16th birthday. Adults who have certain high-risk conditions should receive one or more doses of vaccine.  Hepatitis A vaccine. Adults who wish to be protected from this disease, have certain high-risk conditions, work with hepatitis A-infected animals, work in hepatitis A research labs, or travel to or work in countries with a high rate of hepatitis A should be immunized. Adults who were previously  unvaccinated and who anticipate close contact with an international adoptee during the first 60 days after arrival in the [place] from a country with a high rate of hepatitis A should be immunized.  Hepatitis B vaccine. Adults who wish to be protected from this disease, have certain high-risk conditions, may be exposed to blood or other infectious body fluids, are household contacts or sex partners of hepatitis B positive people, are clients or workers in certain care facilities, or travel to or work in countries with a high rate of hepatitis B should be immunized.  Haemophilus influenzae type b (Hib) vaccine. A previously unvaccinated person with asplenia or sickle cell disease or having a scheduled splenectomy should receive 1 dose of Hib vaccine. Regardless of  previous immunization, a recipient of a hematopoietic stem cell transplant should receive a 3-dose series 6-12 months after her successful transplant. Hib vaccine is not recommended for adults with HIV infection. Preventive Services / Frequency Ages 46 to 48 years  Blood pressure check.** / Every 3-5 years.  Lipid and cholesterol check.** / Every 5 years beginning at age 26.  Clinical breast exam.** / Every 3 years for women in their 63s and 69s.  BRCA-related cancer risk assessment.** / For women who have family members with a BRCA-related cancer (breast, ovarian, tubal, or peritoneal cancers).  Pap test.** / Every 2 years from ages 94 through 80. Every 3 years starting at age 70 through age 30 or 73 with a history of 3 consecutive normal Pap tests.  HPV screening.** / Every 3 years from ages 74 through ages 55 to 27 with a history of 3 consecutive normal Pap tests.  Hepatitis C blood test.** / For any individual with known risks for hepatitis C.  Skin self-exam. / Monthly.  Influenza vaccine. / Every year.  Tetanus, diphtheria, and acellular pertussis (Tdap, Td) vaccine.** / Consult your health care provider. Pregnant women should receive 1 dose of Tdap vaccine during each pregnancy. 1 dose of Td every 10 years.  Varicella vaccine.** / Consult your health care provider. Pregnant females who do not have evidence of immunity should receive the first dose after pregnancy.  HPV vaccine. / 3 doses over 6 months, if 53 and younger. The vaccine is not recommended for use in pregnant females. However, pregnancy testing is not needed before receiving a dose.  Measles, mumps, rubella (MMR) vaccine.** / You need at least 1 dose of MMR if you were born in 1957 or later. You may also need a 2nd dose. For females of childbearing age, rubella immunity should be determined. If there is no evidence of immunity, females who are not pregnant should be vaccinated. If there is no evidence of immunity,  females who are pregnant should delay immunization until after pregnancy.  Pneumococcal 13-valent conjugate (PCV13) vaccine.** / Consult your health care provider.  Pneumococcal polysaccharide (PPSV23) vaccine.** / 1 to 2 doses if you smoke cigarettes or if you have certain conditions.  Meningococcal vaccine.** / 1 dose if you are age 73 to 75 years and a Market researcher living in a residence hall, or have one of several medical conditions, you need to get vaccinated against meningococcal disease. You may also need additional booster doses.  Hepatitis A vaccine.** / Consult your health care provider.  Hepatitis B vaccine.** / Consult your health care provider.  Haemophilus influenzae type b (Hib) vaccine.** / Consult your health care provider. Ages 32 to 47 years  Blood pressure check.** / Every year.  Lipid  and cholesterol check.** / Every 5 years beginning at age 70 years.  Lung cancer screening. / Every year if you are aged 77-80 years and have a 30-pack-year history of smoking and currently smoke or have quit within the past 15 years. Yearly screening is stopped once you have quit smoking for at least 15 years or develop a health problem that would prevent you from having lung cancer treatment.  Clinical breast exam.** / Every year after age 55 years.  BRCA-related cancer risk assessment.** / For women who have family members with a BRCA-related cancer (breast, ovarian, tubal, or peritoneal cancers).  Mammogram.** / Every year beginning at age 1 years and continuing for as long as you are in good health. Consult with your health care provider.  Pap test.** / Every 3 years starting at age 57 years through age 1 or 53 years with a history of 3 consecutive normal Pap tests.  HPV screening.** / Every 3 years from ages 48 years through ages 23 to 33 years with a history of 3 consecutive normal Pap tests.  Fecal occult blood test (FOBT) of stool. / Every year beginning at  age 33 years and continuing until age 12 years. You may not need to do this test if you get a colonoscopy every 10 years.  Flexible sigmoidoscopy or colonoscopy.** / Every 5 years for a flexible sigmoidoscopy or every 10 years for a colonoscopy beginning at age 51 years and continuing until age 41 years.  Hepatitis C blood test.** / For all people born from [birth year] through 1964 and any individual with known risks for hepatitis C.  Skin self-exam. / Monthly.  Influenza vaccine. / Every year.  Tetanus, diphtheria, and acellular pertussis (Tdap/Td) vaccine.** / Consult your health care provider. Pregnant women should receive 1 dose of Tdap vaccine during each pregnancy. 1 dose of Td every 10 years.  Varicella vaccine.** / Consult your health care provider. Pregnant females who do not have evidence of immunity should receive the first dose after pregnancy.  Zoster vaccine.** / 1 dose for adults aged 56 years or older.  Measles, mumps, rubella (MMR) vaccine.** / You need at least 1 dose of MMR if you were born in 1957 or later. You may also need a second dose. For females of childbearing age, rubella immunity should be determined. If there is no evidence of immunity, females who are not pregnant should be vaccinated. If there is no evidence of immunity, females who are pregnant should delay immunization until after pregnancy.  Pneumococcal 13-valent conjugate (PCV13) vaccine.** / Consult your health care provider.  Pneumococcal polysaccharide (PPSV23) vaccine.** / 1 to 2 doses if you smoke cigarettes or if you have certain conditions.  Meningococcal vaccine.** / Consult your health care provider.  Hepatitis A vaccine.** / Consult your health care provider.  Hepatitis B vaccine.** / Consult your health care provider.  Haemophilus influenzae type b (Hib) vaccine.** / Consult your health care provider. Ages 37 years and over  Blood pressure check.** / Every year.  Lipid and cholesterol check.**  / Every 5 years beginning at age 60 years.  Lung cancer screening. / Every year if you are aged 76-80 years and have a 30-pack-year history of smoking and currently smoke or have quit within the past 15 years. Yearly screening is stopped once you have quit smoking for at least 15 years or develop a health problem that would prevent you from having lung cancer treatment.  Clinical breast exam.** / Every year after age  40 years.  BRCA-related cancer risk assessment.** / For women who have family members with a BRCA-related cancer (breast, ovarian, tubal, or peritoneal cancers).  Mammogram.** / Every year beginning at age 42 years and continuing for as long as you are in good health. Consult with your health care provider.  Pap test.** / Every 3 years starting at age 38 years through age 26 or 29 years with 3 consecutive normal Pap tests. Testing can be stopped between 65 and 70 years with 3 consecutive normal Pap tests and no abnormal Pap or HPV tests in the past 10 years.  HPV screening.** / Every 3 years from ages 17 years through ages 62 or 17 years with a history of 3 consecutive normal Pap tests. Testing can be stopped between 65 and 70 years with 3 consecutive normal Pap tests and no abnormal Pap or HPV tests in the past 10 years.  Fecal occult blood test (FOBT) of stool. / Every year beginning at age 65 years and continuing until age 69 years. You may not need to do this test if you get a colonoscopy every 10 years.  Flexible sigmoidoscopy or colonoscopy.** / Every 5 years for a flexible sigmoidoscopy or every 10 years for a colonoscopy beginning at age 43 years and continuing until age 83 years.  Hepatitis C blood test.** / For all people born from [birth year] through 1964 and any individual with known risks for hepatitis C.  Osteoporosis screening.** / A one-time screening for women ages 13 years and over and women at risk for fractures or osteoporosis.  Skin self-exam. / Monthly.  Influenza  vaccine. / Every year.  Tetanus, diphtheria, and acellular pertussis (Tdap/Td) vaccine.** / 1 dose of Td every 10 years.  Varicella vaccine.** / Consult your health care provider.  Zoster vaccine.** / 1 dose for adults aged 104 years or older.  Pneumococcal 13-valent conjugate (PCV13) vaccine.** / Consult your health care provider.  Pneumococcal polysaccharide (PPSV23) vaccine.** / 1 dose for all adults aged 93 years and older.  Meningococcal vaccine.** / Consult your health care provider.  Hepatitis A vaccine.** / Consult your health care provider.  Hepatitis B vaccine.** / Consult your health care provider.  Haemophilus influenzae type b (Hib) vaccine.** / Consult your health care provider. ** Family history and personal history of risk and conditions may change your health care provider's recommendations.   This information is not intended to replace advice given to you by your health care provider. Make sure you discuss any questions you have with your health care provider.   Document Released: 10/12/2001 Document Revised: 09/06/2014 Document Reviewed: 01/11/2011 Elsevier Interactive Patient Education 2016 [place] K. Panosh M.D.

## 2016-01-21 NOTE — Progress Notes (Signed)
Pre visit review using our clinic review tool, if applicable. No additional management support is needed unless otherwise documented below in the visit note. 

## 2016-02-03 ENCOUNTER — Telehealth: Payer: Self-pay | Admitting: Internal Medicine

## 2016-02-03 NOTE — Telephone Encounter (Signed)
Bailey Reyes, pt calling for lab results from 5/24 and I do not see results. Please look into this thanks.

## 2016-02-03 NOTE — Telephone Encounter (Signed)
Pt would like to have her lab result from 01/21/16

## 2016-02-20 NOTE — Telephone Encounter (Signed)
Please see scanned documents under lab work

## 2016-02-20 NOTE — Telephone Encounter (Signed)
Left a message for a return call.

## 2016-02-20 NOTE — Telephone Encounter (Signed)
Pt notified of results by telephone.  Copy sent by mail.

## 2016-02-20 NOTE — Telephone Encounter (Signed)
Pt calling to get lab results.

## 2016-02-20 NOTE — Telephone Encounter (Signed)
Blood tests are normal. Please send her a copy of results

## 2016-04-06 ENCOUNTER — Encounter: Payer: Self-pay | Admitting: Internal Medicine

## 2016-04-19 ENCOUNTER — Encounter: Payer: Self-pay | Admitting: Internal Medicine

## 2016-05-25 ENCOUNTER — Ambulatory Visit (INDEPENDENT_AMBULATORY_CARE_PROVIDER_SITE_OTHER): Payer: 59 | Admitting: Internal Medicine

## 2016-05-25 ENCOUNTER — Encounter: Payer: Self-pay | Admitting: Internal Medicine

## 2016-05-25 VITALS — BP 120/82 | Temp 98.8°F | Wt 142.7 lb

## 2016-05-25 DIAGNOSIS — F411 Generalized anxiety disorder: Secondary | ICD-10-CM

## 2016-05-25 MED ORDER — CITALOPRAM HYDROBROMIDE 10 MG PO TABS: 10.0000 mg | ORAL_TABLET | Freq: Every day | ORAL | 1 refills | Status: DC

## 2016-05-25 NOTE — Progress Notes (Signed)
Chief Complaint  Patient presents with  . Anxiety    HPI: Bailey Reyes 25 y.o.    Bf convinced there to come for anxiety . She had an eipsode  That make her more anxious  . Always   Worrisome    Not taking meds  For adhd .   Off for 3 years . Anxiety getting worse   Stress headaches and    Cant talk way out of it  BF noticing  And encouraged to come and  Headache stomach ache stress related.  Weight loss gain   .   panic attacks not much  6-7   Tad:  One in 2 months caffine  every 2 day s.    Day to day anxiety  Takes 12 minut to go out of house  Takes pic afraid something will get stolen?  Also  Some fear of germs  dosent like to hug incase could get a disease .   Worse recently but doesn't  hink she has ocd   Hard to  Attend  fro her adhd  Not taking med by choice  .   fam hx bio may have had anxiety  Adoptive gm ok Gm Denies TD  etoh not usuall except episode .   ROS: See pertinent positives and negatives per HPI.  Past Medical History:  Diagnosis Date  . ADD (attention deficit disorder)    dx in college  sx since grade school had ha with med    . Exercise-induced asthma    emprici dx  given  inhaler prn no current use    Family History  Problem Relation Age of Onset  . Rheum arthritis Maternal Grandmother     on     Social History   Social History  . Marital status: Single    Spouse name: N/A  . Number of children: N/A  . Years of education: N/A   Social History Main Topics  . Smoking status: Never Smoker  . Smokeless tobacco: Never Used  . Alcohol use 0.0 oz/week     Comment: average 2 drinks weekly  . Drug use: No  . Sexual activity: Not Asked   Other Topics Concern  . None   Social History Narrative   From [place] bf   [employer] graduate    Client associ  Employed    Raised by gm since age 89 years   Dx adhd college    G0P0    Outpatient Medications Prior to Visit  Medication Sig Dispense Refill  . ORTHO TRI-CYCLEN, 28,  0.18/0.215/0.25 MG-35 MCG tablet DAW     No facility-administered medications prior to visit.      EXAM:  BP 120/82 (BP Location: Right Arm, Patient Position: Sitting, Cuff Size: Normal)   Temp 98.8 F (37.1 C) (Oral)   Wt 142 lb 11.2 oz (64.7 kg)   BMI 23.39 kg/m   Body mass index is 23.39 kg/m.  GENERAL: vitals reviewed and listed above, alert, oriented, appears well hydrated and in no acute distress HEENT: atraumatic, conjunctiva  clear, no obvious abnormalities on inspection of external nose and ears  No tremor PSYCH: talkative  pleasant and cooperative,  Well groomed   Positive   Anxiety nl speech will smile  Beck anxiety index  29 this week  ( severe)Normal felt 9  ( mild)  ASSESSMENT AND PLAN:  Discussed the following assessment and plan:  Anxiety state - ocd componenet ? frozen behavior  in addition  to adhd  checking behavior from anxiety ? Recent  worsening  After situation  and cant "let it go"  Lots of catastrophic thinking  And particular  Worries  Exaggerated  Interfering with behavior and  Making herself feel bad.  -Patient advised to return or notify health care team  if symptoms worsen ,persist or new concerns arise.   Expectant management. begin low dose ssri and plan increase think would be helped but counseling therapy also  CBT etc . ROV in 3-4 weeks  ora s needed in interim   Patient Instructions  Begin low dose medication daily to see if helps anxiety over tim e May take weeks to help and we adjust doses ober weeks. If helpful   We remain on helpful med for months to see if goes into remission.   Continue  Life style anxiety measures  You may benefit from counseling strategies for the anxiety .   Anxiety and adhd both decreases short term memory and focus   Generalized Anxiety Disorder Generalized anxiety disorder (GAD) is a mental disorder. It interferes with life functions, including relationships, work, and school. GAD is different from normal  anxiety, which everyone experiences at some point in their lives in response to specific life events and activities. Normal anxiety actually helps [place] prepare for and get through these life events and activities. Normal anxiety goes away after the event or activity is over.  GAD causes anxiety that is not necessarily related to specific events or activities. It also causes excess anxiety in proportion to specific events or activities. The anxiety associated with GAD is also difficult to control. GAD can vary from mild to severe. People with severe GAD can have intense waves of anxiety with physical symptoms (panic attacks).  SYMPTOMS The anxiety and worry associated with GAD are difficult to control. This anxiety and worry are related to many life events and activities and also occur more days than not for 6 months or longer. People with GAD also have three or more of the following symptoms (one or more in children):  Restlessness.   Fatigue.  Difficulty concentrating.   Irritability.  Muscle tension.  Difficulty sleeping or unsatisfying sleep. DIAGNOSIS GAD is diagnosed through an assessment by your health care provider. Your health care provider will ask you questions aboutyour mood,physical symptoms, and events in your life. Your health care provider may ask you about your medical history and use of alcohol or drugs, including prescription medicines. Your health care provider may also do a physical exam and blood tests. Certain medical conditions and the use of certain substances can cause symptoms similar to those associated with GAD. Your health care provider may refer you to a mental health specialist for further evaluation. TREATMENT The following therapies are usually used to treat GAD:   Medication. Antidepressant medication usually is prescribed for long-term daily control. Antianxiety medicines may be added in severe cases, especially when panic attacks occur.   Talk therapy  (psychotherapy). Certain types of talk therapy can be helpful in treating GAD by providing support, education, and guidance. A form of talk therapy called cognitive behavioral therapy can teach you healthy ways to think about and react to daily life events and activities.  Stress managementtechniques. These include yoga, meditation, and exercise and can be very helpful when they are practiced regularly. A mental health specialist can help determine which treatment is best for you. Some people see improvement with one therapy. However, other people require a combination of therapies.  This information is not intended to replace advice given to you by your health care provider. Make sure you discuss any questions you have with your health care provider.   Document Released: 12/11/2012 Document Revised: 09/06/2014 Document Reviewed: 12/11/2012 Elsevier Interactive Patient Education [employer]     [place]. Salvatrice Morandi M.D.

## 2016-05-25 NOTE — Progress Notes (Signed)
Pre visit review using our clinic review tool, if applicable. No additional management support is needed unless otherwise documented below in the visit note. 

## 2016-05-25 NOTE — Patient Instructions (Signed)
Begin low dose medication daily to see if helps anxiety over tim e May take weeks to help and we adjust doses ober weeks. If helpful   We remain on helpful med for months to see if goes into remission.   Continue  Life style anxiety measures  You may benefit from counseling strategies for the anxiety .   Anxiety and adhd both decreases short term memory and focus   Generalized Anxiety Disorder Generalized anxiety disorder (GAD) is a mental disorder. It interferes with life functions, including relationships, work, and school. GAD is different from normal anxiety, which everyone experiences at some point in their lives in response to specific life events and activities. Normal anxiety actually helps us prepare for and get through these life events and activities. Normal anxiety goes away after the event or activity is over.  GAD causes anxiety that is not necessarily related to specific events or activities. It also causes excess anxiety in proportion to specific events or activities. The anxiety associated with GAD is also difficult to control. GAD can vary from mild to severe. People with severe GAD can have intense waves of anxiety with physical symptoms (panic attacks).  SYMPTOMS The anxiety and worry associated with GAD are difficult to control. This anxiety and worry are related to many life events and activities and also occur more days than not for 6 months or longer. People with GAD also have three or more of the following symptoms (one or more in children):  Restlessness.   Fatigue.  Difficulty concentrating.   Irritability.  Muscle tension.  Difficulty sleeping or unsatisfying sleep. DIAGNOSIS GAD is diagnosed through an assessment by your health care provider. Your health care provider will ask you questions aboutyour mood,physical symptoms, and events in your life. Your health care provider may ask you about your medical history and use of alcohol or drugs, including  prescription medicines. Your health care provider may also do a physical exam and blood tests. Certain medical conditions and the use of certain substances can cause symptoms similar to those associated with GAD. Your health care provider may refer you to a mental health specialist for further evaluation. TREATMENT The following therapies are usually used to treat GAD:   Medication. Antidepressant medication usually is prescribed for long-term daily control. Antianxiety medicines may be added in severe cases, especially when panic attacks occur.   Talk therapy (psychotherapy). Certain types of talk therapy can be helpful in treating GAD by providing support, education, and guidance. A form of talk therapy called cognitive behavioral therapy can teach you healthy ways to think about and react to daily life events and activities.  Stress managementtechniques. These include yoga, meditation, and exercise and can be very helpful when they are practiced regularly. A mental health specialist can help determine which treatment is best for you. Some people see improvement with one therapy. However, other people require a combination of therapies.   This information is not intended to replace advice given to you by your health care provider. Make sure you discuss any questions you have with your health care provider.   Document Released: 12/11/2012 Document Revised: 09/06/2014 Document Reviewed: 12/11/2012 Elsevier Interactive Patient Education [employer]2016 [employer] Inc[employer]

## 2016-06-03 ENCOUNTER — Encounter: Payer: Self-pay | Admitting: Internal Medicine
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
## 2016-06-24 ENCOUNTER — Encounter: Payer: Self-pay | Admitting: Internal Medicine

## 2016-06-24 ENCOUNTER — Ambulatory Visit (INDEPENDENT_AMBULATORY_CARE_PROVIDER_SITE_OTHER): Payer: 59 | Admitting: Internal Medicine

## 2016-06-24 VITALS — BP 120/78 | Temp 98.1°F | Wt 145.2 lb

## 2016-06-24 DIAGNOSIS — F40298 Other specified phobia: Secondary | ICD-10-CM

## 2016-06-24 DIAGNOSIS — F411 Generalized anxiety disorder: Secondary | ICD-10-CM | POA: Diagnosis not present

## 2016-06-24 DIAGNOSIS — Z79899 Other long term (current) drug therapy: Secondary | ICD-10-CM

## 2016-06-24 MED ORDER — CITALOPRAM HYDROBROMIDE 10 MG PO TABS: 20.0000 mg | ORAL_TABLET | Freq: Every day | ORAL | 1 refills | Status: DC

## 2016-06-24 NOTE — Patient Instructions (Signed)
Increase to 15 mg per day and then 20 mg per day . Still consider counseling .  For reasons discussed .  ROV in 2 month or as needed.  Glad you are having some improvement .

## 2016-08-20 ENCOUNTER — Other Ambulatory Visit: Payer: Self-pay | Admitting: Internal Medicine

## 2016-08-24 ENCOUNTER — Ambulatory Visit (INDEPENDENT_AMBULATORY_CARE_PROVIDER_SITE_OTHER): Payer: 59 | Admitting: Internal Medicine

## 2016-08-24 ENCOUNTER — Encounter: Payer: Self-pay | Admitting: Internal Medicine

## 2016-08-24 VITALS — BP 104/70 | Temp 98.1°F | Wt 150.7 lb

## 2016-08-24 DIAGNOSIS — F411 Generalized anxiety disorder: Secondary | ICD-10-CM

## 2016-08-24 DIAGNOSIS — Z79899 Other long term (current) drug therapy: Secondary | ICD-10-CM | POA: Diagnosis not present

## 2016-08-24 MED ORDER — CITALOPRAM HYDROBROMIDE 20 MG PO TABS: 20.0000 mg | ORAL_TABLET | Freq: Every day | ORAL | 3 refills | Status: DC

## 2016-08-24 NOTE — Progress Notes (Signed)
Pre visit review using our clinic review tool, if applicable. No additional management support is needed unless otherwise documented below in the visit note.  Chief Complaint  Patient presents with  . Follow-up    HPI: Bailey Reyes 25 y.o.   Fu meds for anxiety   Last seen 10 17  Under rx for add by other cliniciam on  Less agitated  Feels much better and her fianc noticed she is more functional less irritable. She feels that her panic is much better and she isn't as worried about germs. She states her headaches are better also that will probably triggered by stress and anxiety. Sleep    Is  Ok  And   rtaking 20 mg in am   7 30 and before   10   3-4 weeks.  ROS: See pertinent positives and negatives per HPI.  Past Medical History:  Diagnosis Date  . ADD (attention deficit disorder)    dx in college  sx since grade school had ha with med    . Exercise-induced asthma    emprici dx  given  inhaler prn no current use    Family History  Problem Relation Age of Onset  . Rheum arthritis Maternal Grandmother     on     Social History   Social History  . Marital status: Single    Spouse name: N/A  . Number of children: N/A  . Years of education: N/A   Social History Main Topics  . Smoking status: Never Smoker  . Smokeless tobacco: Never Used  . Alcohol use 0.0 oz/week     Comment: average 2 drinks weekly  . Drug use: No  . Sexual activity: Not Asked   Other Topics Concern  . None   Social History Narrative   [place] [employer] lives with bf   [employer] graduate    Client associ  Employed    Raised by gm since age 89 years   Dx adhd college    G0P0    Outpatient Medications Prior to Visit  Medication Sig Dispense Refill  . ORTHO TRI-CYCLEN, 28, 0.18/0.215/0.25 MG-35 MCG tablet DAW    . citalopram (CELEXA) 10 MG tablet TAKE 2 TABLETS (20 MG TOTAL) BY MOUTH DAILY. OR AS DIRECTED . 60 tablet 0   No facility-administered medications prior to visit.      EXAM:  BP  104/70 (BP Location: Right Arm, Patient Position: Sitting, Cuff Size: Normal)   Temp 98.1 F (36.7 C) (Oral)   Wt 150 lb 11.2 oz (68.4 kg)   BMI 24.70 kg/m   Body mass index is 24.7 kg/m.  GENERAL: vitals reviewed and listed above, alert, oriented, appears well hydrated and in no acute distress HEENT: atraumatic, conjunctiva  clear, no obvious abnormalities on inspection of external nose and ears  PSYCH: pleasant and cooperative, no obvious depression or anxiety some excess motor activity but looks comfortable. No tremor  PHQ-SADS Somatic:1 GAD7:2 no panic  PHQ9:3( concentration felt from adhd) Difficulty : somewhat   ASSESSMENT AND PLAN:  Discussed the following assessment and plan:  Anxiety state - Much improved on 20 mg citalopram without significant side effects continue citalopram rov  2-3 months.or as needed  Medication management  -Patient advised to return or notify health care team  if symptoms worsen ,persist or new concerns arise.  Patient Instructions  Glad you're doing better on medication Stay on medication as planned Make a follow-up office visit for 2-3 months from now or as  needed. If you feel you want to stop the medication for any reason. please contact us.    Neta MendsWanda K. Orey Moure M.D.

## 2016-08-24 NOTE — Patient Instructions (Signed)
Glad you're doing better on medication Stay on medication as planned Make a follow-up office visit for 2-3 months from now or as needed. If you feel you want to stop the medication for any reason. please contact us.

## 2016-10-26 ENCOUNTER — Telehealth: Payer: Self-pay | Admitting: Internal Medicine
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
## 2016-10-27 NOTE — Telephone Encounter (Signed)
FYI

## 2016-11-19 NOTE — Progress Notes (Signed)
Chief Complaint  Patient presents with  . Follow-up    HPI: Bailey Reyes 26 y.o. come in for  Fu meds for anxiety  She continues on 20 mg of Celexa and feels like she is in a good place. Is able to go to public bathrooms do different things that she couldn't do before because of her anxiety. Her significant other's also noticed this and she feels like she is functioning well at work. Has only had one panic episode in the last 3 months. She did have some increased anxiety and checking behavior when they got a new puppy and she couldn't remember if she turns something off. But other than that feels she is more back to normal. Has not gotten in with the counselor think she's doing well now now. ROS: See pertinent positives and negatives per HPI.  Past Medical History:  Diagnosis Date  . ADD (attention deficit disorder)    dx in college  sx since grade school had ha with med    . Exercise-induced asthma    emprici dx  given  inhaler prn no current use    Family History  Problem Relation Age of Onset  . Rheum arthritis Maternal Grandmother     on     Social History   Social History  . Marital status: Single    Spouse name: N/A  . Number of children: N/A  . Years of education: N/A   Social History Main Topics  . Smoking status: Never Smoker  . Smokeless tobacco: Never Used  . Alcohol use 0.0 oz/week     Comment: average 2 drinks weekly  . Drug use: No  . Sexual activity: Not Asked   Other Topics Concern  . None   Social History Narrative   [place] [employer] [place] with bf   [employer] graduate    Client associ  Employed    Raised by gm since age 89 years   Dx adhd college    G0P0    Outpatient Medications Prior to Visit  Medication Sig Dispense Refill  . ORTHO TRI-CYCLEN, 28, 0.18/0.215/0.25 MG-35 MCG tablet DAW    . citalopram (CELEXA) 20 MG tablet Take 1 tablet (20 mg total) by mouth daily. 30 tablet 3   No facility-administered medications prior to visit.       EXAM:  BP 110/70 (BP Location: Right Arm, Patient Position: Sitting, Cuff Size: Normal)   Pulse 88   Temp 98.7 F (37.1 C) (Oral)   Ht 5' 5.5" (1.664 m)   Wt 159 lb 3.2 oz (72.2 kg)   BMI 26.09 kg/m   Body mass index is 26.09 kg/m.  GENERAL: vitals reviewed and listed above, alert, oriented, appears well hydrated and in no acute distress HEENT: atraumatic, conjunctiva  clear, no obvious abnormalities on inspection of external nose and ears PSYCH: pleasant and cooperative, no obvious depression or anxiety speech is normal does have a little bit of handwringing. Looks comfortable. No results found for: WBC, HGB, HCT, PLT, GLUCOSE, CHOL, TRIG, HDL, LDLDIRECT, LDLCALC, ALT, AST, NA, K, CL, CREATININE, BUN, CO2, TSH, PSA, INR, GLUF, HGBA1C, MICROALBUR BP Readings from Last 3 Encounters:  11/22/16 110/70  08/24/16 104/70  06/24/16 120/78    ASSESSMENT AND PLAN:  Discussed the following assessment and plan:  Anxiety state - Improved on medication continue lifestyle intervention strategies as discussed CPX in for 6 months.w med check   Medication management No untoward side effects of med  -Patient advised to return or  notify health care team  if  new concerns arise.  Patient Instructions  Continue medication  And lifestyle  intervnetions.   Contact us if need more refills   CPX  And med check in 4-6 months     Neta Mends. Coleman Kalas M.D.

## 2016-11-22 ENCOUNTER — Encounter: Payer: Self-pay | Admitting: Internal Medicine

## 2016-11-22 ENCOUNTER — Ambulatory Visit (INDEPENDENT_AMBULATORY_CARE_PROVIDER_SITE_OTHER): Payer: 59 | Admitting: Internal Medicine

## 2016-11-22 VITALS — BP 110/70 | HR 88 | Temp 98.7°F | Ht 65.5 in | Wt 159.2 lb

## 2016-11-22 DIAGNOSIS — F411 Generalized anxiety disorder: Secondary | ICD-10-CM

## 2016-11-22 DIAGNOSIS — Z79899 Other long term (current) drug therapy: Secondary | ICD-10-CM | POA: Diagnosis not present

## 2016-11-22 MED ORDER — CITALOPRAM HYDROBROMIDE 20 MG PO TABS: 20.0000 mg | ORAL_TABLET | Freq: Every day | ORAL | 1 refills | Status: DC

## 2016-11-22 NOTE — Patient Instructions (Signed)
Continue medication  And lifestyle  intervnetions.   Contact us if need more refills   CPX  And med check in 4-6 months

## 2016-12-09 ENCOUNTER — Encounter (HOSPITAL_COMMUNITY): Payer: Self-pay | Admitting: Emergency Medicine

## 2016-12-09 ENCOUNTER — Emergency Department (HOSPITAL_COMMUNITY)
Admission: EM | Admit: 2016-12-09 | Discharge: 2016-12-09 | Disposition: A | Discharge status: Home / Self Care | Payer: 59 | Attending: Emergency Medicine | Admitting: Emergency Medicine

## 2016-12-09 ENCOUNTER — Emergency Department (HOSPITAL_COMMUNITY): Payer: 59

## 2016-12-09 DIAGNOSIS — R220 Localized swelling, mass and lump, head: Secondary | ICD-10-CM | POA: Diagnosis not present

## 2016-12-09 DIAGNOSIS — F909 Attention-deficit hyperactivity disorder, unspecified type: Secondary | ICD-10-CM | POA: Insufficient documentation

## 2016-12-09 DIAGNOSIS — Y9389 Activity, other specified: Secondary | ICD-10-CM | POA: Diagnosis not present

## 2016-12-09 DIAGNOSIS — S0993XA Unspecified injury of face, initial encounter: Secondary | ICD-10-CM

## 2016-12-09 DIAGNOSIS — Z79899 Other long term (current) drug therapy: Secondary | ICD-10-CM | POA: Insufficient documentation

## 2016-12-09 DIAGNOSIS — Y92410 Unspecified street and highway as the place of occurrence of the external cause: Secondary | ICD-10-CM | POA: Insufficient documentation

## 2016-12-09 DIAGNOSIS — S80211A Abrasion, right knee, initial encounter: Secondary | ICD-10-CM | POA: Diagnosis not present

## 2016-12-09 DIAGNOSIS — S40212A Abrasion of left shoulder, initial encounter: Secondary | ICD-10-CM | POA: Insufficient documentation

## 2016-12-09 DIAGNOSIS — S0081XA Abrasion of other part of head, initial encounter: Secondary | ICD-10-CM

## 2016-12-09 DIAGNOSIS — Y999 Unspecified external cause status: Secondary | ICD-10-CM | POA: Insufficient documentation

## 2016-12-09 MED ORDER — AMOXICILLIN 500 MG PO CAPS
500.0000 mg | ORAL_CAPSULE | Freq: Once | ORAL | Status: AC
  Administered 2016-12-09: 500 mg via ORAL
  Filled 2016-12-09: qty 1

## 2016-12-09 MED ORDER — NAPROXEN 250 MG PO TABS: 250.0000 mg | ORAL_TABLET | Freq: Two times a day (BID) | ORAL | 0 refills | Status: DC

## 2016-12-09 MED ORDER — BACITRACIN ZINC 500 UNIT/GM EX OINT: 1.0000 "application " | TOPICAL_OINTMENT | Freq: Two times a day (BID) | CUTANEOUS | 1 refills | Status: DC

## 2016-12-09 MED ORDER — BACITRACIN ZINC 500 UNIT/GM EX OINT
1.0000 "application " | TOPICAL_OINTMENT | Freq: Two times a day (BID) | CUTANEOUS | Status: DC
  Administered 2016-12-09: 1 via TOPICAL
  Filled 2016-12-09: qty 3.6

## 2016-12-09 MED ORDER — AMOXICILLIN 500 MG PO CAPS
500.0000 mg | ORAL_CAPSULE | Freq: Three times a day (TID) | ORAL | 0 refills | Status: DC
Start: 2016-12-09 — End: 2017-02-18

## 2016-12-09 MED ORDER — OXYCODONE-ACETAMINOPHEN 5-325 MG PO TABS
1.0000 | ORAL_TABLET | Freq: Once | ORAL | Status: AC
  Administered 2016-12-09: 1 via ORAL
  Filled 2016-12-09: qty 1

## 2016-12-09 MED ORDER — ONDANSETRON 8 MG PO TBDP
8.0000 mg | ORAL_TABLET | Freq: Once | ORAL | Status: AC
  Administered 2016-12-09: 8 mg via ORAL
  Filled 2016-12-09: qty 1

## 2016-12-09 NOTE — ED Notes (Signed)
PT DISCHARGED. INSTRUCTIONS AND PRESCRIPTIONS GIVEN. AAOX4. PT IN NO APPARENT DISTRESS. THE OPPORTUNITY TO ASK QUESTIONS WAS PROVIDED. 
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
## 2016-12-09 NOTE — ED Notes (Signed)
ED Provider at bedside. 
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
## 2016-12-10 DIAGNOSIS — S0242XA Fracture of alveolus of maxilla, initial encounter for closed fracture: Secondary | ICD-10-CM | POA: Diagnosis not present

## 2017-02-17 ENCOUNTER — Encounter: Payer: Self-pay | Admitting: Internal Medicine

## 2017-02-17 NOTE — Telephone Encounter (Signed)
Please advise  and help  patient to make appt to discuss her medication concerns  (not an sda )

## 2017-02-17 NOTE — Progress Notes (Signed)
Chief Complaint  Patient presents with  . Medication Management    HPI: Bailey Reyes 26 y.o. come in for concerns about meds   Weight gain  In past 5 months  Worried about not getting in to her  [employer] dress   To be married in December . works [employer]   40 per week .   Sits all day and runs.   Bring own lunch .   Eats out 3 x per week  Exercise  And quits  .   Goes for a run.     celexa helps a lot but frustrated gaining weight  Without changing  Habits per se   intensifying  In past 2 weeks but not loosing weight  Hs cut aout small amts of soda  For last 1-2 weeks  doesn't really want to dec or go off  Anxiety meds .  Needs adivce  Sleep ok  No sig etoh Eats very fast  145 last nov 2017  Sleep no sleep .  Sleep  7-8  etoh    3 a month  .  ROS: See pertinent positives and negatives per HPI.  Past Medical History:  Diagnosis Date  . ADD (attention deficit disorder)    dx in college  sx since grade school had ha with med    . Exercise-induced asthma    emprici dx  given  inhaler prn no current use    Family History  Problem Relation Age of Onset  . Rheum arthritis Maternal Grandmother        on     Social History   Social History  . Marital status: Single    Spouse name: N/A  . Number of children: N/A  . Years of education: N/A   Social History Main Topics  . Smoking status: Never Smoker  . Smokeless tobacco: Never Used  . Alcohol use 0.0 oz/week     Comment: average 2 drinks weekly  . Drug use: No  . Sexual activity: Not Asked   Other Topics Concern  . None   Social History Narrative   [place] with bf   [employer] graduate    Client associ  Employed    Raised by gm since age 89 years   Dx adhd college    G0P0    Outpatient Medications Prior to Visit  Medication Sig Dispense Refill  . citalopram (CELEXA) 20 MG tablet Take 1 tablet (20 mg total) by mouth daily. 90 tablet 1  . ORTHO TRI-CYCLEN, 28, 0.18/0.215/0.25 MG-35 MCG tablet DAW    .  amoxicillin (AMOXIL) 500 MG capsule Take 1 capsule (500 mg total) by mouth 3 (three) times daily. 21 capsule 0  . bacitracin ointment Apply 1 application topically 2 (two) times daily. 28 g 1  . naproxen (NAPROSYN) 250 MG tablet Take 1 tablet (250 mg total) by mouth 2 (two) times daily with a meal. 30 tablet 0   No facility-administered medications prior to visit.      EXAM:  BP 90/70 (BP Location: Left Arm, Patient Position: Sitting, Cuff Size: Normal)   Pulse 71   Temp 98.6 F (37 C) (Oral)   Wt 161 lb 9.6 oz (73.3 kg)   LMP 02/02/2017   BMI 26.89 kg/m   Body mass index is 26.89 kg/m.  GENERAL: vitals reviewed and listed above, alert, oriented, appears well hydrated and in no acute distress HEENT: atraumatic, conjunctiva  clear, no obvious abnormalities on inspection of external nose  and earsMS: moves all extremities without noticeable focal  abnormality PSYCH: pleasant and cooperative, no obvious depression or anxiety som inc motor activity but nl speech  No results found for: WBC, HGB, HCT, PLT, GLUCOSE, CHOL, TRIG, HDL, LDLDIRECT, LDLCALC, ALT, AST, NA, K, CL, CREATININE, BUN, CO2, TSH, PSA, INR, GLUF, HGBA1C, MICROALBUR BP Readings from Last 3 Encounters:  02/18/17 90/70  12/09/16 (!) 128/92  11/22/16 110/70   Wt Readings from Last 3 Encounters:  02/18/17 161 lb 9.6 oz (73.3 kg)  12/09/16 159 lb 11.2 oz (72.4 kg)  11/22/16 159 lb 3.2 oz (72.2 kg)    ASSESSMENT AND PLAN:  Discussed the following assessment and plan:  Weight gain finding  Anxiety state  Medication management Strategies discussed  And   options   Dec citalopram but no carb craving describe but less stressed and eats out a lot .  alos check thyroid functions but doubt a factor tends to mood eat but has been better but anxiou not going to fit in dress -Patient advised to return or notify health care team  if  new concerns arise. Total visit > 50% spent counseling and coordinating care as  indicated in above note and in instructions to patient . Can contact us in month e mail . Other meds may not be any better  Patient Instructions  Medication may be  Only  Partly  Related .   No calories  In beverages .  No sodas or juices.  Limit portion size smaller plates   Gets your sleep  .   Dec eating out.  No processed carbs of no low carbs for a months  Continue activity  Dec p butter portion  As we discussed.  Lots of water.  As planned .   Continue  Let us know if you want to try dec dose of citalopram.   E mail in a month about what happening  Can check thyroid blood tst but I doubt   that is the problem      Burna Mortimer K. Muriah Harsha M.D.

## 2017-02-18 ENCOUNTER — Encounter: Payer: Self-pay | Admitting: Internal Medicine

## 2017-02-18 ENCOUNTER — Ambulatory Visit (INDEPENDENT_AMBULATORY_CARE_PROVIDER_SITE_OTHER): Payer: 59 | Admitting: Internal Medicine

## 2017-02-18 ENCOUNTER — Telehealth: Payer: Self-pay | Admitting: Emergency Medicine

## 2017-02-18 VITALS — BP 90/70 | HR 71 | Temp 98.6°F | Wt 161.6 lb

## 2017-02-18 DIAGNOSIS — Z79899 Other long term (current) drug therapy: Secondary | ICD-10-CM

## 2017-02-18 DIAGNOSIS — F411 Generalized anxiety disorder: Secondary | ICD-10-CM | POA: Diagnosis not present

## 2017-02-18 DIAGNOSIS — R635 Abnormal weight gain: Secondary | ICD-10-CM | POA: Diagnosis not present

## 2017-02-18 NOTE — Telephone Encounter (Signed)
Patient needs to make an appointment to discuss medication. Dr. Fabian SharpPanosh doesn't want this patient to be scheduled in an SDA slot. Could you please help patient schedule appointment.

## 2017-02-18 NOTE — Patient Instructions (Signed)
Medication may be  Only  Partly  Related .   No calories  In beverages .  No sodas or juices.  Limit portion size smaller plates   Gets your sleep  .   Dec eating out.  No processed carbs of no low carbs for a months  Continue activity  Dec p butter portion  As we discussed.  Lots of water.  As planned .   Continue  Let us know if you want to try dec dose of citalopram.   E mail in a month about what happening  Can check thyroid blood tst but I doubt   that is the problem

## 2017-02-21 NOTE — Telephone Encounter (Signed)
lmom for pt to callback to sch °

## 2017-02-23 NOTE — Telephone Encounter (Signed)
Pt has been sch already

## 2017-03-14 DIAGNOSIS — Z01419 Encounter for gynecological examination (general) (routine) without abnormal findings: Secondary | ICD-10-CM | POA: Diagnosis not present

## 2017-03-15 DIAGNOSIS — Z01419 Encounter for gynecological examination (general) (routine) without abnormal findings: Secondary | ICD-10-CM | POA: Diagnosis not present

## 2017-04-18 NOTE — Progress Notes (Signed)
Chief Complaint  Patient presents with  . Annual Exam    HPI: Patient  Bailey Reyes  26 y.o. comes in today for Preventive Health Care visit  And med management  Citalopram.   No stress ha and  Stomach issues   feels medications working quite well she now feels more normal when she leaves the house does not have to do excessive checking. No major change in health although did have a dental injury when she was trying to train her dog to run while she was riding a bike. No residual.  Health Maintenance  Topic Date Due  . INFLUENZA VACCINE  03/30/2017  . HIV Screening  06/23/2017 (Originally 12/19/2006)  . PAP SMEAR  03/03/2018  . 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 GI /GU: No adominal pain, vomiting, change in bowel habits. No blood in the stool. No significant GU symptoms. SKIN/HEME: ,no acute skin rashes suspicious lesions or bleeding. No lymphadenopathy, nodules, masses.  NEURO/ PSYCH:  No neurologic signs such as weakness numbness. No depression anxiety. IMM/ Allergy: No unusual infections.  Allergy .   REST of 12 system review negative except as per HPI   Past Medical History:  Diagnosis Date  . ADD (attention deficit disorder)    dx in college  sx since grade school had ha with med    . Exercise-induced asthma    emprici dx  given  inhaler prn no current use    Past Surgical History:  Procedure Laterality Date  . TONSILLECTOMY AND ADENOIDECTOMY  2011    Family History  Problem Relation Age of  Onset  . Rheum arthritis Maternal Grandmother        on     Social History   Social History  . Marital status: Single    Spouse name: N/A  . Number of children: N/A  . Years of education: N/A   Social History Main Topics  . Smoking status: Never Smoker  . Smokeless tobacco: Never Used  . Alcohol use 0.0 oz/week     Comment: average 2 drinks weekly  . Drug use: No  . Sexual activity: Not Asked   Other Topics Concern  . None   Social History Narrative   From [employer] [employer] lives with bf   [employer] graduate    Client associ  Employed    Raised by gm since age 44 years   Dx adhd college    G0P0    Outpatient Medications Prior to Visit  Medication Sig Dispense Refill  . ORTHO TRI-CYCLEN, 28, 0.18/0.215/0.25 MG-35 MCG tablet DAW    . citalopram (CELEXA) 20 MG tablet Take 1 tablet (20 mg total) by mouth daily. 90 tablet 1   No facility-administered medications prior to visit.      EXAM:  BP 100/78 (BP Location: Right Arm, Patient Position: Sitting, Cuff Size: Normal)   Pulse 72   Temp 98.1 F (36.7 C) (Oral)   Ht 5' 5.25" (1.657 m)  Wt 163 lb (73.9 kg)   BMI 26.92 kg/m   Body mass index is 26.92 kg/m. Wt Readings from Last 3 Encounters:  04/19/17 163 lb (73.9 kg)  02/18/17 161 lb 9.6 oz (73.3 kg)  12/09/16 159 lb 11.2 oz (72.4 kg)    Physical Exam: Vital signs reviewed CNO:BSJG is a well-developed well-nourished alert cooperative    who appearsr stated age in no acute distress.  HEENT: normocephalic atraumatic , Eyes: PERRL EOM's full, conjunctiva clear, Nares: paten,t no deformity discharge or tenderness., Ears: no deformity EAC's clear TMs with normal landmarks. Mouth: clear OP, no lesions, edema.  Moist mucous membranes. Dentition in adequate repair. NECK: supple without masses, thyromegaly or bruits. CHEST/PULM:  Clear to auscultation and percussion breath sounds equal no wheeze , rales or rhonchi. No chest wall deformities or tenderness. Breast: normal by  inspection . No dimpling, discharge, masses, tenderness or discharge . CV: PMI is nondisplaced, S1 S2 no gallops, murmurs, rubs. Peripheral pulses are full without delay.No JVD .  ABDOMEN: Bowel sounds normal nontender  No guard or rebound, no hepato splenomegal no CVA tenderness.  No hernia. Extremtities:  No clubbing cyanosis or edema, no acute joint swelling or redness no focal atrophy NEURO:  Oriented x3, cranial nerves 3-12 appear to be intact, no obvious focal weakness,gait within normal limits no abnormal reflexes or asymmetrical SKIN: No acute rashes normal turgor, color, no bruising or petechiae. PSYCH: Oriented, good eye contact, no obvious depression anxiety, cognition and judgment appear normal. LN: no cervical axillary inguinal adenopathy Blood work done in the last year reviewed all normal in scanned documents. Favorable lipid profile. No results found for: WBC, HGB, HCT, PLT, GLUCOSE, CHOL, TRIG, HDL, LDLDIRECT, LDLCALC, ALT, AST, NA, K, CL, CREATININE, BUN, CO2, TSH, PSA, INR, GLUF, HGBA1C, MICROALBUR  BP Readings from Last 3 Encounters:  04/19/17 100/78  02/18/17 90/70  12/09/16 (!) 128/92   Wt Readings from Last 3 Encounters:  04/19/17 163 lb (73.9 kg)  02/18/17 161 lb 9.6 oz (73.3 kg)  12/09/16 159 lb 11.2 oz (72.4 kg)    Lab results reviewed with patient   ASSESSMENT AND PLAN:  Discussed the following assessment and plan:  Visit for preventive health examination  Medication management She is up-to-date on her Pap no indication to repeat blood work today continue on the citalopram as is helpful decided to come back in a year or as needed. HCP reveiwed  Injury prevention pt aware .  Patient Care Team: Burnis Medin, MD as PCP - General (Internal Medicine) Patient Instructions   Continue lifestyle intervention healthy eating and exercise . Yearly check up  Sending in a year of medication.    Preventive Care for [place], Female The transition to life  after high school as a young adult can be a stressful time with many changes. You may start seeing a primary care physician instead of a pediatrician. This is the time when your health care becomes your responsibility. Preventive care refers to lifestyle choices and visits with your health care provider that can promote health and wellness. What does preventive care include?  A yearly physical exam. This is also called an annual wellness visit.  Dental exams once or twice a year.  Routine eye exams. Ask your health care provider how often you should have your eyes checked.  Personal lifestyle choices, including: ? Daily care of your teeth and gums. ? Regular physical activity. ? Eating a healthy diet. ? Avoiding tobacco and drug use. ?  Avoiding or limiting alcohol use. ? Practicing safe sex. ? Taking vitamin and mineral supplements as recommended by your health care provider. What happens during an annual wellness visit? Preventive care starts with a yearly visit to your primary care physician. The services and screenings done by your health care provider during your annual wellness visit will depend on your overall health, lifestyle risk factors, and family history of disease. Counseling Your health care provider may ask you questions about:  Past medical problems and your family's medical history.  Medicines or supplements you take.  Health insurance and access to health care.  Alcohol, tobacco, and drug use.  Your safety at home, work, or school.  Access to firearms.  Emotional well-being and how you cope with stress.  Relationship well-being.  Diet, exercise, and sleep habits.  Your sexual health and activity.  Your methods of birth control.  Your menstrual cycle.  Your pregnancy history.  Screening You may have the following tests or measurements:  Height, weight, and BMI.  Blood pressure.  Lipid and cholesterol levels.  Tuberculosis skin test.  Skin  exam.  Vision and hearing tests.  Screening test for hepatitis.  Screening tests for sexually transmitted diseases (STDs), if you are at risk.  BRCA-related cancer screening. This may be done if you have a family history of breast, ovarian, tubal, or peritoneal cancers.  Pelvic exam and Pap test. This may be done every 3 years starting at age 25.  Vaccines Your health care provider may recommend certain vaccines, such as:  Influenza vaccine. This is recommended every year.  Tetanus, diphtheria, and acellular pertussis (Tdap, Td) vaccine. You may need a Td booster every 10 years.  Varicella vaccine. You may need this if you have not been vaccinated.  HPV vaccine. If you are 69 or younger, you may need three doses over 6 months.  Measles, mumps, and rubella (MMR) vaccine. You may need at least one dose of MMR. You may also need a second dose.  Pneumococcal 13-valent conjugate (PCV13) vaccine. You may need this if you have certain conditions and were not previously vaccinated.  Pneumococcal polysaccharide (PPSV23) vaccine. You may need one or two doses if you smoke cigarettes or if you have certain conditions.  Meningococcal vaccine. One dose is recommended if you are age 64-21 years and a first-year college student living in a residence hall, or if you have one of several medical conditions. You may also need additional booster doses.  Hepatitis A vaccine. You may need this if you have certain conditions or if you travel or work in places where you may be exposed to hepatitis A.  Hepatitis B vaccine. You may need this if you have certain conditions or if you travel or work in places where you may be exposed to hepatitis B.  Haemophilus influenzae type b (Hib) vaccine. You may need this if you have certain risk factors.  Talk to your health care provider about which screenings and vaccines you need and how often you need them. What steps can I take to develop healthy  behaviors?  Have regular preventive health care visits with your primary care physician and dentist.  Eat a healthy diet.  Drink enough fluid to keep your urine clear or pale yellow.  Stay active. Exercise at least 30 minutes 5 or more days of the week.  Use alcohol responsibly.  Maintain a healthy weight.  Do not use any products that contain nicotine, such as cigarettes, chewing tobacco, and e-cigarettes. If  you need help quitting, ask your health care provider.  Do not use drugs.  Practice safe sex.  Use birth control (contraception) to prevent unwanted pregnancy. If you plan to become pregnant, see your health care provider for a pre-conception visit.  Find healthy ways to manage stress. How can I protect myself from injury? Injuries from violence or accidents are the leading cause of death among young adults and can often be prevented. Take these steps to help protect yourself:  Always wear your seat belt while driving or riding in a vehicle.  Do not drive if you have been drinking alcohol. Do not ride with someone who has been drinking.  Do not drive when you are tired or distracted. Do not text while driving.  Wear a helmet and other protective equipment during sports activities.  If you have firearms in your house, make sure you follow all gun safety procedures.  Seek help if you have been bullied, physically abused, or sexually abused.  Use the Internet responsibly to avoid dangers such as online bullying and online sexual predators.  What can I do to cope with stress? Young adults may face many new challenges that can be stressful, such as finding a job, going to college, moving away from home, managing money, being in a relationship, getting married, and having children. To manage stress:  Avoid known stressful situations when you can.  Exercise regularly.  Find a stress-reducing activity that works best for you. Examples include meditation, yoga, listening  to music, or reading.  Spend time in nature.  Keep a journal to write about your stress and how you respond.  Talk to your health care provider about stress. He or she may suggest counseling.  Spend time with supportive friends or family.  Do not cope with stress by: ? Drinking alcohol or using drugs. ? Smoking cigarettes. ? Eating.  Where can I get more information? Learn more about preventive care and healthy habits from:  [place] and Gynecologists: KaraokeExchange.nl  U.S. Probation officer Task Force: StageSync.si  National Adolescent and [place]: StrategicRoad.nl  American Academy of Pediatrics Bright Futures: https://brightfutures.MemberVerification.co.za  Society for Adolescent Health and Medicine: MoralBlog.co.za.aspx  PodExchange.nl: ToyLending.fr  This information is not intended to replace advice given to you by your health care provider. Make sure you discuss any questions you have with your health care provider. Document Released: 01/01/2016 Document Revised: 01/22/2016 Document Reviewed: 01/01/2016 Elsevier Interactive Patient Education  2017 [place]. Vraj Denardo M.D.

## 2017-04-19 ENCOUNTER — Encounter: Payer: Self-pay | Admitting: Internal Medicine

## 2017-04-19 ENCOUNTER — Ambulatory Visit (INDEPENDENT_AMBULATORY_CARE_PROVIDER_SITE_OTHER): Payer: 59 | Admitting: Internal Medicine

## 2017-04-19 VITALS — BP 100/78 | HR 72 | Temp 98.1°F | Ht 65.25 in | Wt 163.0 lb

## 2017-04-19 DIAGNOSIS — Z79899 Other long term (current) drug therapy: Secondary | ICD-10-CM

## 2017-04-19 DIAGNOSIS — Z0000 Encounter for general adult medical examination without abnormal findings: Secondary | ICD-10-CM | POA: Diagnosis not present

## 2017-04-19 MED ORDER — CITALOPRAM HYDROBROMIDE 20 MG PO TABS: 20.0000 mg | ORAL_TABLET | Freq: Every day | ORAL | 3 refills | Status: DC

## 2017-04-19 NOTE — Patient Instructions (Signed)
Continue lifestyle intervention healthy eating and exercise . Yearly check up  Sending in a year of medication.    Preventive Care for Young Adults, Female The transition to life after high school as a young adult can be a stressful time with many changes. You may start seeing a primary care physician instead of a pediatrician. This is the time when your health care becomes your responsibility. Preventive care refers to lifestyle choices and visits with your health care provider that can promote health and wellness. What does preventive care include?  A yearly physical exam. This is also called an annual wellness visit.  Dental exams once or twice a year.  Routine eye exams. Ask your health care provider how often you should have your eyes checked.  Personal lifestyle choices, including: ? Daily care of your teeth and gums. ? Regular physical activity. ? Eating a healthy diet. ? Avoiding tobacco and drug use. ? Avoiding or limiting alcohol use. ? Practicing safe sex. ? Taking vitamin and mineral supplements as recommended by your health care provider. What happens during an annual wellness visit? Preventive care starts with a yearly visit to your primary care physician. The services and screenings done by your health care provider during your annual wellness visit will depend on your overall health, lifestyle risk factors, and family history of disease. Counseling Your health care provider may ask you questions about:  Past medical problems and your family's medical history.  Medicines or supplements you take.  Health insurance and access to health care.  Alcohol, tobacco, and drug use.  Your safety at home, work, or school.  Access to firearms.  Emotional well-being and how you cope with stress.  Relationship well-being.  Diet, exercise, and sleep habits.  Your sexual health and activity.  Your methods of birth control.  Your menstrual cycle.  Your pregnancy  history.  Screening You may have the following tests or measurements:  Height, weight, and BMI.  Blood pressure.  Lipid and cholesterol levels.  Tuberculosis skin test.  Skin exam.  Vision and hearing tests.  Screening test for hepatitis.  Screening tests for sexually transmitted diseases (STDs), if you are at risk.  BRCA-related cancer screening. This may be done if you have a family history of breast, ovarian, tubal, or peritoneal cancers.  Pelvic exam and Pap test. This may be done every 3 years starting at age 21.  Vaccines Your health care provider may recommend certain vaccines, such as:  Influenza vaccine. This is recommended every year.  Tetanus, diphtheria, and acellular pertussis (Tdap, Td) vaccine. You may need a Td booster every 10 years.  Varicella vaccine. You may need this if you have not been vaccinated.  HPV vaccine. If you are 26 or younger, you may need three doses over 6 months.  Measles, mumps, and rubella (MMR) vaccine. You may need at least one dose of MMR. You may also need a second dose.  Pneumococcal 13-valent conjugate (PCV13) vaccine. You may need this if you have certain conditions and were not previously vaccinated.  Pneumococcal polysaccharide (PPSV23) vaccine. You may need one or two doses if you smoke cigarettes or if you have certain conditions.  Meningococcal vaccine. One dose is recommended if you are age 19-21 years and a first-year college student living in a residence hall, or if you have one of several medical conditions. You may also need additional booster doses.  Hepatitis A vaccine. You may need this if you have certain conditions or if you travel   or work in places where you may be exposed to hepatitis A.  Hepatitis B vaccine. You may need this if you have certain conditions or if you travel or work in places where you may be exposed to hepatitis B.  Haemophilus influenzae type b (Hib) vaccine. You may need this if you have  certain risk factors.  Talk to your health care provider about which screenings and vaccines you need and how often you need them. What steps can I take to develop healthy behaviors?  Have regular preventive health care visits with your primary care physician and dentist.  Eat a healthy diet.  Drink enough fluid to keep your urine clear or pale yellow.  Stay active. Exercise at least 30 minutes 5 or more days of the week.  Use alcohol responsibly.  Maintain a healthy weight.  Do not use any products that contain nicotine, such as cigarettes, chewing tobacco, and e-cigarettes. If you need help quitting, ask your health care provider.  Do not use drugs.  Practice safe sex.  Use birth control (contraception) to prevent unwanted pregnancy. If you plan to become pregnant, see your health care provider for a pre-conception visit.  Find healthy ways to manage stress. How can I protect myself from injury? Injuries from violence or accidents are the leading cause of death among young adults and can often be prevented. Take these steps to help protect yourself:  Always wear your seat belt while driving or riding in a vehicle.  Do not drive if you have been drinking alcohol. Do not ride with someone who has been drinking.  Do not drive when you are tired or distracted. Do not text while driving.  Wear a helmet and other protective equipment during sports activities.  If you have firearms in your house, make sure you follow all gun safety procedures.  Seek help if you have been bullied, physically abused, or sexually abused.  Use the Internet responsibly to avoid dangers such as online bullying and online sexual predators.  What can I do to cope with stress? Young adults may face many new challenges that can be stressful, such as finding a job, going to college, moving away from home, managing money, being in a relationship, getting married, and having children. To manage  stress:  Avoid known stressful situations when you can.  Exercise regularly.  Find a stress-reducing activity that works best for you. Examples include meditation, yoga, listening to music, or reading.  Spend time in nature.  Keep a journal to write about your stress and how you respond.  Talk to your health care provider about stress. He or she may suggest counseling.  Spend time with supportive friends or family.  Do not cope with stress by: ? Drinking alcohol or using drugs. ? Smoking cigarettes. ? Eating.  Where can I get more information? Learn more about preventive care and healthy habits from:  American College of Obstetricians and Gynecologists: www.acog.org/Patients  U.S. Preventive Services Task Force: www.uspreventiveservicestaskforce.org/Tools/ConsumerInfo/Index/information-for-consumers  National Adolescent and Young Adult Health Information Center: http://nahic.ucsf.edu/resource-center/  American Academy of Pediatrics Bright Futures: https://brightfutures.aap.org  Society for Adolescent Health and Medicine: www.adolescenthealth.org/Resources/Clinical-Care-Resources/Mental-Health/Mental-Health-Resources-For-Adolesc.aspx  HealthCare.gov: www.healthcare.gov/young-adults/coverage/  This information is not intended to replace advice given to you by your health care provider. Make sure you discuss any questions you have with your health care provider. Document Released: 01/01/2016 Document Revised: 01/22/2016 Document Reviewed: 01/01/2016 Elsevier Interactive Patient Education  2017 Elsevier Inc.  

## 2017-05-20 ENCOUNTER — Encounter: Payer: Self-pay | Admitting: Internal Medicine

## 2017-06-21 DIAGNOSIS — Z23 Encounter for immunization: Secondary | ICD-10-CM | POA: Diagnosis not present

## 2017-10-06 DIAGNOSIS — Z719 Counseling, unspecified: Secondary | ICD-10-CM | POA: Diagnosis not present

## 2017-10-10 ENCOUNTER — Encounter: Payer: Self-pay | Admitting: Internal Medicine

## 2017-12-05 ENCOUNTER — Encounter: Payer: Self-pay | Admitting: Internal Medicine

## 2017-12-05 ENCOUNTER — Other Ambulatory Visit: Payer: Self-pay | Admitting: Internal Medicine

## 2017-12-06 NOTE — Telephone Encounter (Signed)
Dr. Fabian SharpPanosh please advise.  Pt is currently taking 20mg  Celexa daily and would like cut them in half to take 10mg  daily until her next appointment on 04/24/18.  Reply to Ashtyn.

## 2017-12-06 NOTE — Telephone Encounter (Signed)
Sure ok to dec to 10 mg per day  .   As requested   Please  Update her med list

## 2018-01-17 ENCOUNTER — Ambulatory Visit (INDEPENDENT_AMBULATORY_CARE_PROVIDER_SITE_OTHER): Payer: 59 | Admitting: Internal Medicine

## 2018-01-17 ENCOUNTER — Encounter: Payer: Self-pay | Admitting: Internal Medicine

## 2018-01-17 VITALS — BP 106/68 | HR 78 | Temp 98.2°F | Wt 180.1 lb

## 2018-01-17 DIAGNOSIS — R202 Paresthesia of skin: Secondary | ICD-10-CM

## 2018-01-17 DIAGNOSIS — Z79899 Other long term (current) drug therapy: Secondary | ICD-10-CM | POA: Diagnosis not present

## 2018-01-17 DIAGNOSIS — F411 Generalized anxiety disorder: Secondary | ICD-10-CM | POA: Diagnosis not present

## 2018-01-17 DIAGNOSIS — T50905A Adverse effect of unspecified drugs, medicaments and biological substances, initial encounter: Secondary | ICD-10-CM | POA: Diagnosis not present

## 2018-01-17 DIAGNOSIS — R635 Abnormal weight gain: Secondary | ICD-10-CM | POA: Diagnosis not present

## 2018-01-17 NOTE — Progress Notes (Signed)
Chief Complaint  Patient presents with  . Anxiety    Pt having increased Anxiety meds x 5 days ago. Having increased anxiety since stopping. Pt state that she has been having numbness and tingling in hands/fingers, forearm and feet. Pt states that she read this could be a side effect of the medication so she stopped it but the symptoms are still present. Pt has been having the numbness and tingling x 1 year.    HPI: Bailey Reyes 27 y.o. come in for   Problems sda   Last seen a year ago   Taking 1/2  10 mg  celexa  And then      Felt ok and  Had side effects  .    Delayed side effects.   Now off  5-6 days.   Hard to remember    Things since then .    And most concern that she has had some  tingling for a year.     Left distal arm and some times her feet    Leg  But no weakness    Thought from med  And also     Weight gain  That she is working on .    She has been of  The ssri for almost a week and denies feeling like a WD syndrome .  Dealing with anxiety Area of tingling in medial forearm down to  Thumb and index finger   No weakness   Then feet off and on but not as much .  ROS: See pertinent positives and negatives per HPI. Tingling multiple times a week   ? If from weight gain .     No injury is right handed  Take mvi no supp and no restrictive diet   Work is keyboarding all day  Sedentary   Periods nl for her Past Medical History:  Diagnosis Date  . ADD (attention deficit disorder)    dx in college  sx since grade school had ha with med    . Exercise-induced asthma    emprici dx  given  inhaler prn no current use    Family History  Problem Relation Age of Onset  . Rheum arthritis Maternal Grandmother        on     Social History   Socioeconomic History  . Marital status: Married    Spouse name: Not on file  . Number of children: Not on file  . Years of education: Not on file  . Highest education level: Not on file  Occupational History  . Not on file  Social  Needs  . Financial resource strain: Not on file  . Food insecurity:    Worry: Not on file    Inability: Not on file  . Transportation needs:    Medical: Not on file    Non-medical: Not on file  Tobacco Use  . Smoking status: Never Smoker  . Smokeless tobacco: Never Used  Substance and Sexual Activity  . Alcohol use: Yes    Alcohol/week: 0.0 oz    Comment: average 2 drinks weekly  . Drug use: No  . Sexual activity: Not on file  Lifestyle  . Physical activity:    Days per week: Not on file    Minutes per session: Not on file  . Stress: Not on file  Relationships  . Social connections:    Talks on phone: Not on file    Gets together: Not on file  Attends religious service: Not on file    Active member of club or organization: Not on file    Attends meetings of clubs or organizations: Not on file    Relationship status: Not on file  Other Topics Concern  . Not on file  Social History Narrative   From [place] lives with bf   [employer] graduate    [employer]  Employed    Raised by gm since age 41 years   Dx adhd college    [id number]    Outpatient Medications Prior to Visit  Medication Sig Dispense Refill  . ORTHO TRI-CYCLEN, 28, 0.18/0.215/0.25 MG-35 MCG tablet DAW    . citalopram (CELEXA) 20 MG tablet TAKE 1 TABLET BY MOUTH EVERY DAY (Patient not taking: Reported on 01/17/2018) 90 tablet 0   No facility-administered medications prior to visit.      EXAM:  BP 106/68 (BP Location: Right Arm, Patient Position: Sitting, Cuff Size: Normal)   Pulse 78   Temp 98.2 F (36.8 C) (Oral)   Wt 180 lb 1.6 oz (81.7 kg)   BMI 29.74 kg/m   Body mass index is 29.74 kg/m.  GENERAL: vitals reviewed and listed above, alert, oriented, appears well hydrated and in no acute distress rapid speech but nl  Otherwise  HEENT: atraumatic, conjunctiva  clear, no obvious abnormalities on inspection of external nose and ears OP : no lesion edema or exudate  NECK: no obvious masses on inspection  palpation  LUNGS: clear to auscultation bilaterally, no wheezes, rales or rhonchi, good air movement CV: HRRR, no clubbing cyanosis or  peripheral edema nl cap refill  MS: moves all extremities without noticeable focal  Abnormality  No atrophy grip ok and  No cce  .woabd PSYCH: pleasant and cooperative,  A bit worried but    Fine with plan   No results found for: WBC, HGB, HCT, PLT, GLUCOSE, CHOL, TRIG, HDL, LDLDIRECT, LDLCALC, ALT, AST, NA, K, CL, CREATININE, BUN, CO2, TSH, PSA, INR, GLUF, HGBA1C, MICROALBUR BP Readings from Last 3 Encounters:  01/17/18 106/68  04/19/17 100/78  02/18/17 90/70    ASSESSMENT AND PLAN:  Discussed the following assessment and plan:  Tingling in extremities - left forearm to thumb - Plan: Basic metabolic panel, Hemoglobin A1c, CBC with Differential/Platelet, Hepatic function panel, TSH, T4, free, T3, free, Magnesium, Sedimentation rate, Vitamin B12  Weight gain due to medication - Plan: Basic metabolic panel, Hemoglobin A1c, CBC with Differential/Platelet, Hepatic function panel, TSH, T4, free, T3, free, Magnesium, Sedimentation rate, Vitamin B12  Medication management  Anxiety state  ? If compression issue  Exam is reassuring and if metabolic r/o then can watch and get neuro if  persistent or progressive but for now will wait    Disc the wean from citalopram    She feels no rebound at this time  Offered 5 mg qod but she feels that not needed.  Appears was more worried about the  Tinging than the baseline anxiety  Blood owrk today  NF   Had lunch  -Patient advised to return or notify health care team  if  new concerns arise.  Patient Instructions  screening lab  for  Blood sugars  And  Thyroid anemia check   Depending on results  We may get neurology to see.  If  persistent or progressive   But can wait for now as you exam is reassuring.       Neta Mends. Panosh M.D.

## 2018-01-17 NOTE — Patient Instructions (Signed)
screening lab  for  Blood sugars  And  Thyroid anemia check   Depending on results  We may get neurology to see.  If  persistent or progressive   But can wait for now as you exam is reassuring.

## 2018-01-18 LAB — CBC WITH DIFFERENTIAL/PLATELET
BASOS ABS: 0.1 10*3/uL (ref 0.0–0.1)
Basophils Relative: 1.3 % (ref 0.0–3.0)
EOS ABS: 0.1 10*3/uL (ref 0.0–0.7)
Eosinophils Relative: 1.5 % (ref 0.0–5.0)
HCT: 36.3 % (ref 36.0–46.0)
Hemoglobin: 12.1 g/dL (ref 12.0–15.0)
LYMPHS PCT: 32.2 % (ref 12.0–46.0)
Lymphs Abs: 2.2 10*3/uL (ref 0.7–4.0)
MCHC: 33.4 g/dL (ref 30.0–36.0)
MCV: 84.9 fl (ref 78.0–100.0)
MONOS PCT: 10.5 % (ref 3.0–12.0)
Monocytes Absolute: 0.7 10*3/uL (ref 0.1–1.0)
NEUTROS PCT: 54.5 % (ref 43.0–77.0)
Neutro Abs: 3.7 10*3/uL (ref 1.4–7.7)
Platelets: 276 10*3/uL (ref 150.0–400.0)
RBC: 4.28 Mil/uL (ref 3.87–5.11)
RDW: 14.5 % (ref 11.5–15.5)
WBC: 6.8 10*3/uL (ref 4.0–10.5)

## 2018-01-18 LAB — T3, FREE: T3, Free: 3.8 pg/mL (ref 2.3–4.2)

## 2018-01-18 LAB — HEPATIC FUNCTION PANEL
ALBUMIN: 4.1 g/dL (ref 3.5–5.2)
ALK PHOS: 67 U/L (ref 39–117)
ALT: 18 U/L (ref 0–35)
AST: 20 U/L (ref 0–37)
BILIRUBIN DIRECT: 0 mg/dL (ref 0.0–0.3)
BILIRUBIN TOTAL: 0.2 mg/dL (ref 0.2–1.2)
Total Protein: 7.3 g/dL (ref 6.0–8.3)

## 2018-01-18 LAB — BASIC METABOLIC PANEL
BUN: 11 mg/dL (ref 6–23)
CO2: 26 mEq/L (ref 19–32)
Calcium: 9.5 mg/dL (ref 8.4–10.5)
Chloride: 105 mEq/L (ref 96–112)
Creatinine, Ser: 0.79 mg/dL (ref 0.40–1.20)
GFR: 93.44 mL/min (ref >60.00)
Glucose, Bld: 95 mg/dL (ref 70–99)
POTASSIUM: 4.1 meq/L (ref 3.5–5.1)
SODIUM: 140 meq/L (ref 135–145)

## 2018-01-18 LAB — HEMOGLOBIN A1C: HEMOGLOBIN A1C: 5.8 % (ref 4.6–6.5)

## 2018-01-18 LAB — T4, FREE: Free T4: 0.66 ng/dL (ref 0.60–1.60)

## 2018-01-18 LAB — TSH: TSH: 2.82 u[IU]/mL (ref 0.35–4.50)

## 2018-01-18 LAB — VITAMIN B12: VITAMIN B 12: 317 pg/mL (ref 211–911)

## 2018-01-18 LAB — MAGNESIUM: MAGNESIUM: 2.2 mg/dL (ref 1.5–2.5)

## 2018-01-18 LAB — SEDIMENTATION RATE: SED RATE: 25 mm/h — AB (ref 0–20)

## 2018-03-27 DIAGNOSIS — Z01419 Encounter for gynecological examination (general) (routine) without abnormal findings: Secondary | ICD-10-CM | POA: Diagnosis not present

## 2018-04-24 ENCOUNTER — Encounter: Payer: 59 | Admitting: Internal Medicine

## 2018-05-02 NOTE — Progress Notes (Signed)
Chief Complaint  Patient presents with  . Annual Exam    no new concerns    HPI: Patient  Bailey Reyes  27 y.o. comes in today for Preventive Health Care visit   ocps via  Gyne doing well  periods regular   And utd on pap  Only med ocps doing well off citalopram   Health Maintenance  Topic Date Due  . HIV Screening  12/19/2006  . INFLUENZA VACCINE  11/28/2018 (Originally 03/30/2018)  . PAP SMEAR  03/29/2021  . 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   No weakness pain etc  GEN/ HEENT: No fever, significant weight changes sweats headaches vision problems hearing changes, CV/ PULM; No chest pain shortness of breath cough, syncope,edema  change in exercise tolerance. GI /GU: No adominal pain, vomiting, change in bowel habits. No blood in the stool. No significant GU symptoms. SKIN/HEME: ,no acute skin rashes suspicious lesions or bleeding. No lymphadenopathy, nodules, masses.  NEURO/ PSYCH:  No neurologic signs such as weakness numbness. No depression anxiety. IMM/ Allergy: No unusual infections.  Allergy .   REST of 12 system review negative except as per HPI   Past Medical History:  Diagnosis Date  . ADD (attention deficit disorder)    dx in college  sx since grade school had ha with med    . Exercise-induced asthma    emprici dx  given  inhaler prn no current use    Past Surgical History:  Procedure Laterality Date  . TONSILLECTOMY AND ADENOIDECTOMY  2011    Family History  Problem Relation Age of Onset  . Rheum arthritis Maternal Grandmother        on     Social History   Socioeconomic History  .  Marital status: Married    Spouse name: Not on file  . Number of children: Not on file  . Years of education: Not on file  . Highest education level: Not on file  Occupational History  . Not on file  Social Needs  . Financial resource strain: Not on file  . Food insecurity:    Worry: Not on file    Inability: Not on file  . Transportation needs:    Medical: Not on file    Non-medical: Not on file  Tobacco Use  . Smoking status: Never Smoker  . Smokeless tobacco: Never Used  Substance and Sexual Activity  . Alcohol use: Yes    Alcohol/week: 0.0 standard drinks    Comment: average 2 drinks weekly  . Drug use: No  . Sexual activity: Not on file  Lifestyle  . Physical activity:    Days per week: Not on file    Minutes per session: Not on file  . Stress: Not on file  Relationships  . Social connections:    Talks on phone: Not on file    Gets together: Not on file    Attends religious service: Not on file    Active member of club or organization: Not on file    Attends meetings of clubs or organizations: Not  on file    Relationship status: Not on file  Other Topics Concern  . Not on file  Social History Narrative   From [place] with bf   [employer] graduate    [employer]  Employed    Raised by gm since age 89 years   Dx adhd college    [id number]    Outpatient Medications Prior to Visit  Medication Sig Dispense Refill  . Norgestimate-Ethinyl Estradiol Triphasic (TRI-SPRINTEC) 0.18/0.215/0.25 MG-35 MCG tablet Take 1 tablet by mouth daily.    . citalopram (CELEXA) 20 MG tablet TAKE 1 TABLET BY MOUTH EVERY DAY (Patient not taking: Reported on 01/17/2018) 90 tablet 0  . ORTHO TRI-CYCLEN, 28, 0.18/0.215/0.25 MG-35 MCG tablet DAW     No facility-administered medications prior to visit.      EXAM:  BP 98/70 (BP Location: Right Arm, Patient Position: Sitting, Cuff Size: Normal)   Pulse 70   Temp 98.2 F (36.8 C) (Oral)   Ht 5' 6.25" (1.683 m)   Wt 178 lb 12.8 oz (81.1 kg)    BMI 28.64 kg/m   Body mass index is 28.64 kg/m. Wt Readings from Last 3 Encounters:  05/03/18 178 lb 12.8 oz (81.1 kg)  01/17/18 180 lb 1.6 oz (81.7 kg)  04/19/17 163 lb (73.9 kg)    Physical Exam: Vital signs reviewed GGY:IRSW is a well-developed well-nourished alert cooperative    who appearsr stated age in no acute distress.  Looks well , HEENT: normocephalic atraumatic , Eyes: PERRL EOM's full, conjunctiva clear, Nares: paten,t no deformity discharge or tenderness., Ears: no deformity EAC's clear TMs with normal landmarks. Mouth: clear OP, no lesions, edema.  Moist mucous membranes. Dentition in adequate repair. NECK: supple without masses, thyromegaly or bruits. CHEST/PULM:  Clear to auscultation and percussion breath sounds equal no wheeze , rales or rhonchi. No chest wall deformities or tenderness. Breast: normal by inspection . No dimpling, discharge, masses, tenderness or discharge . CV: PMI is nondisplaced, S1 S2 no gallops, murmurs, rubs. Peripheral pulses are full without delay.No JVD .  ABDOMEN: Bowel sounds normal nontender  No guard or rebound, no hepato splenomegal no CVA tenderness.  No hernia. Extremtities:  No clubbing cyanosis or edema, no acute joint swelling or redness no focal atrophy NEURO:  Oriented x3, cranial nerves 3-12 appear to be intact, no obvious focal weakness,gait within normal limits no abnormal reflexes or asymmetrical SKIN: No acute rashes normal turgor, color, no bruising or petechiae. PSYCH: Oriented, good eye contact, no obvious depression anxiety, cognition and judgment appear normal. LN: no cervical axillary inguinal adenopathy  Lab Results  Component Value Date   WBC 6.8 01/17/2018   HGB 12.1 01/17/2018   HCT 36.3 01/17/2018   PLT 276.0 01/17/2018   GLUCOSE 95 01/17/2018   ALT 18 01/17/2018   AST 20 01/17/2018   NA 140 01/17/2018   K 4.1 01/17/2018   CL 105 01/17/2018   CREATININE 0.79 01/17/2018   BUN 11 01/17/2018   CO2 26  01/17/2018   TSH 2.82 01/17/2018   HGBA1C 5.8 01/17/2018    BP Readings from Last 3 Encounters:  05/03/18 98/70  01/17/18 106/68  04/19/17 100/78    Lab results from last time   reviewed with patient   ASSESSMENT AND PLAN:  Discussed the following assessment and plan:  Visit for preventive health examination - Plan: Lipid panel, Glucose, Random  Screening, lipid - Plan: Lipid panel, Glucose, Random  Screening for diabetes mellitus - Plan: Lipid panel, Glucose,  Random To check fasting BG and lipid  Today  Rest of lab nl in may Patient Care Team: Panosh, Standley Brooking, MD as PCP - General (Internal Medicine) Patient Instructions  Glad  You  Are doing well Get optimum sleep  Check your work station Flu shot this fall.  Will notify you  of labs when available. If all ok then can chjeck spc in 2 years   Healthy lifestyle includes : At least 150 minutes of exercise weeks  , weight at healthy levels, which is usually   BMI 19-25. Avoid trans fats and processed foods;  Increase fresh fruits and veges to 5 servings per day. And avoid sweet beverages including tea and juice. Mediterranean diet with olive oil and nuts have been noted to be heart and brain healthy . Avoid tobacco products . Limit  alcohol to  7 per week for women and 14 servings for men.  Get adequate sleep . Wear seat belts . Don't text and drive .   Health Maintenance, Female Adopting a healthy lifestyle and getting preventive care can go a long way to promote health and wellness. Talk with your health care provider about what schedule of regular examinations is right for you. This is a good chance for you to check in with your provider about disease prevention and staying healthy. In between checkups, there are plenty of things you can do on your own. Experts have done a lot of research about which lifestyle changes and preventive measures are most likely to keep you healthy. Ask your health care provider for more  information. Weight and diet Eat a healthy diet  Be sure to include plenty of vegetables, fruits, low-fat dairy products, and lean protein.  Do not eat a lot of foods high in solid fats, added sugars, or salt.  Get regular exercise. This is one of the most important things you can do for your health. ? Most adults should exercise for at least 150 minutes each week. The exercise should increase your heart rate and make you sweat (moderate-intensity exercise). ? Most adults should also do strengthening exercises at least twice a week. This is in addition to the moderate-intensity exercise.  Maintain a healthy weight  Body mass index (BMI) is a measurement that can be used to identify possible weight problems. It estimates body fat based on height and weight. Your health care provider can help determine your BMI and help you achieve or maintain a healthy weight.  For females 77 years of age and older: ? A BMI below 18.5 is considered underweight. ? A BMI of 18.5 to 24.9 is normal. ? A BMI of 25 to 29.9 is considered overweight. ? A BMI of 30 and above is considered obese.  Watch levels of cholesterol and blood lipids  You should start having your blood tested for lipids and cholesterol at 26 years of age, then have this test every 5 years.  You may need to have your cholesterol levels checked more often if: ? Your lipid or cholesterol levels are high. ? You are older than 26 years of age. ? You are at high risk for heart disease.  Cancer screening Lung Cancer  Lung cancer screening is recommended for adults 68-7 years old who are at high risk for lung cancer because of a history of smoking.  A yearly low-dose CT scan of the lungs is recommended for people who: ? Currently smoke. ? Have quit within the past 15 years. ? Have at least  a 30-pack-year history of smoking. A pack year is smoking an average of one pack of cigarettes a day for 1 year.  Yearly screening should continue  until it has been 15 years since you quit.  Yearly screening should stop if you develop a health problem that would prevent you from having lung cancer treatment.  Breast Cancer  Practice breast self-awareness. This means understanding how your breasts normally appear and feel.  It also means doing regular breast self-exams. Let your health care provider know about any changes, no matter how small.  If you are in your 20s or 30s, you should have a clinical breast exam (CBE) by a health care provider every 1-3 years as part of a regular health exam.  If you are 14 or older, have a CBE every year. Also consider having a breast X-ray (mammogram) every year.  If you have a family history of breast cancer, talk to your health care provider about genetic screening.  If you are at high risk for breast cancer, talk to your health care provider about having an MRI and a mammogram every year.  Breast cancer gene (BRCA) assessment is recommended for women who have family members with BRCA-related cancers. BRCA-related cancers include: ? Breast. ? Ovarian. ? Tubal. ? Peritoneal cancers.  Results of the assessment will determine the need for genetic counseling and BRCA1 and BRCA2 testing.  Cervical Cancer Your health care provider may recommend that you be screened regularly for cancer of the pelvic organs (ovaries, uterus, and vagina). This screening involves a pelvic examination, including checking for microscopic changes to the surface of your cervix (Pap test). You may be encouraged to have this screening done every 3 years, beginning at age 62.  For women ages 6-65, health care providers may recommend pelvic exams and Pap testing every 3 years, or they may recommend the Pap and pelvic exam, combined with testing for human papilloma virus (HPV), every 5 years. Some types of HPV increase your risk of cervical cancer. Testing for HPV may also be done on women of any age with unclear Pap test  results.  Other health care providers may not recommend any screening for nonpregnant women who are considered low risk for pelvic cancer and who do not have symptoms. Ask your health care provider if a screening pelvic exam is right for you.  If you have had past treatment for cervical cancer or a condition that could lead to cancer, you need Pap tests and screening for cancer for at least 20 years after your treatment. If Pap tests have been discontinued, your risk factors (such as having a new sexual partner) need to be reassessed to determine if screening should resume. Some women have medical problems that increase the chance of getting cervical cancer. In these cases, your health care provider may recommend more frequent screening and Pap tests.  Colorectal Cancer  This type of cancer can be detected and often prevented.  Routine colorectal cancer screening usually begins at 26 years of age and continues through 26 years of age.  Your health care provider may recommend screening at an earlier age if you have risk factors for colon cancer.  Your health care provider may also recommend using home test kits to check for hidden blood in the stool.  A small camera at the end of a tube can be used to examine your colon directly (sigmoidoscopy or colonoscopy). This is done to check for the earliest forms of colorectal cancer.  Routine  screening usually begins at age 78.  Direct examination of the colon should be repeated every 5-10 years through 26 years of age. However, you may need to be screened more often if early forms of precancerous polyps or small growths are found.  Skin Cancer  Check your skin from head to toe regularly.  Tell your health care provider about any new moles or changes in moles, especially if there is a change in a mole's shape or color.  Also tell your health care provider if you have a mole that is larger than the size of a pencil eraser.  Always use sunscreen.  Apply sunscreen liberally and repeatedly throughout the day.  Protect yourself by wearing long sleeves, pants, a wide-brimmed hat, and sunglasses whenever you are outside.  Heart disease, diabetes, and high blood pressure  High blood pressure causes heart disease and increases the risk of stroke. High blood pressure is more likely to develop in: ? People who have blood pressure in the high end of the normal range (130-139/85-89 mm Hg). ? People who are overweight or obese. ? People who are African American.  If you are 34-107 years of age, have your blood pressure checked every 3-5 years. If you are 41 years of age or older, have your blood pressure checked every year. You should have your blood pressure measured twice-once when you are at a hospital or clinic, and once when you are not at a hospital or clinic. Record the average of the two measurements. To check your blood pressure when you are not at a hospital or clinic, you can use: ? An automated blood pressure machine at a pharmacy. ? A home blood pressure monitor.  If you are between 85 years and 86 years old, ask your health care provider if you should take aspirin to prevent strokes.  Have regular diabetes screenings. This involves taking a blood sample to check your fasting blood sugar level. ? If you are at a normal weight and have a low risk for diabetes, have this test once every three years after 26 years of age. ? If you are overweight and have a high risk for diabetes, consider being tested at a younger age or more often. Preventing infection Hepatitis B  If you have a higher risk for hepatitis B, you should be screened for this virus. You are considered at high risk for hepatitis B if: ? You were born in a country where hepatitis B is common. Ask your health care provider which countries are considered high risk. ? Your parents were born in a high-risk country, and you have not been immunized against hepatitis B (hepatitis B  vaccine). ? You have HIV or AIDS. ? You use needles to inject street drugs. ? You live with someone who has hepatitis B. ? You have had sex with someone who has hepatitis B. ? You get hemodialysis treatment. ? You take certain medicines for conditions, including cancer, organ transplantation, and autoimmune conditions.  Hepatitis C  Blood testing is recommended for: ? Everyone born from [birth year] through 1964. ? Anyone with known risk factors for hepatitis C.  Sexually transmitted infections (STIs)  You should be screened for sexually transmitted infections (STIs) including gonorrhea and chlamydia if: ? You are sexually active and are younger than 26 years of age. ? You are older than 26 years of age and your health care provider tells you that you are at risk for this type of infection. ? Your sexual activity has  changed since you were last screened and you are at an increased risk for chlamydia or gonorrhea. Ask your health care provider if you are at risk.  If you do not have HIV, but are at risk, it may be recommended that you take a prescription medicine daily to prevent HIV infection. This is called pre-exposure prophylaxis (PrEP). You are considered at risk if: ? You are sexually active and do not regularly use condoms or know the HIV status of your partner(s). ? You take drugs by injection. ? You are sexually active with a partner who has HIV.  Talk with your health care provider about whether you are at high risk of being infected with HIV. If you choose to begin PrEP, you should first be tested for HIV. You should then be tested every 3 months for as long as you are taking PrEP. Pregnancy  If you are premenopausal and you may become pregnant, ask your health care provider about preconception counseling.  If you may become pregnant, take 400 to 800 micrograms (mcg) of folic acid every day.  If you want to prevent pregnancy, talk to your health care provider about birth control  (contraception). Osteoporosis and menopause  Osteoporosis is a disease in which the bones lose minerals and strength with aging. This can result in serious bone fractures. Your risk for osteoporosis can be identified using a bone density scan.  If you are 14 years of age or older, or if you are at risk for osteoporosis and fractures, ask your health care provider if you should be screened.  Ask your health care provider whether you should take a calcium or vitamin D supplement to lower your risk for osteoporosis.  Menopause may have certain physical symptoms and risks.  Hormone replacement therapy may reduce some of these symptoms and risks. Talk to your health care provider about whether hormone replacement therapy is right for you. Follow these instructions at home:  Schedule regular health, dental, and eye exams.  Stay current with your immunizations.  Do not use any tobacco products including cigarettes, chewing tobacco, or electronic cigarettes.  If you are pregnant, do not drink alcohol.  If you are breastfeeding, limit how much and how often you drink alcohol.  Limit alcohol intake to no more than 1 drink per day for nonpregnant women. One drink equals 12 ounces of beer, 5 ounces of wine, or 1 ounces of hard liquor.  Do not use street drugs.  Do not share needles.  Ask your health care provider for help if you need support or information about quitting drugs.  Tell your health care provider if you often feel depressed.  Tell your health care provider if you have ever been abused or do not feel safe at home. This information is not intended to replace advice given to you by your health care provider. Make sure you discuss any questions you have with your health care provider. Document Released: 03/01/2011 Document Revised: 01/22/2016 Document Reviewed: 05/20/2015 Elsevier Interactive Patient Education  2018 [place]. Panosh M.D.

## 2018-05-03 ENCOUNTER — Encounter: Payer: Self-pay | Admitting: Internal Medicine

## 2018-05-03 ENCOUNTER — Ambulatory Visit (INDEPENDENT_AMBULATORY_CARE_PROVIDER_SITE_OTHER): Payer: 59 | Admitting: Internal Medicine

## 2018-05-03 VITALS — BP 98/70 | HR 70 | Temp 98.2°F | Ht 66.25 in | Wt 178.8 lb

## 2018-05-03 DIAGNOSIS — Z0000 Encounter for general adult medical examination without abnormal findings: Secondary | ICD-10-CM | POA: Diagnosis not present

## 2018-05-03 DIAGNOSIS — Z131 Encounter for screening for diabetes mellitus: Secondary | ICD-10-CM | POA: Diagnosis not present

## 2018-05-03 DIAGNOSIS — Z13220 Encounter for screening for lipoid disorders: Secondary | ICD-10-CM

## 2018-05-03 LAB — LIPID PANEL
Cholesterol: 161 mg/dL (ref 0–200)
HDL: 44.1 mg/dL (ref >39.00)
LDL Cholesterol: 99 mg/dL (ref 0–99)
NONHDL: 117.38
Total CHOL/HDL Ratio: 4
Triglycerides: 91 mg/dL (ref 0.0–149.0)
VLDL: 18.2 mg/dL (ref 0.0–40.0)

## 2018-05-03 LAB — GLUCOSE, RANDOM: GLUCOSE: 86 mg/dL (ref 70–99)

## 2018-05-03 NOTE — Patient Instructions (Signed)
Glad  You  Are doing well Get optimum sleep  Check your work station Flu shot this fall.  Will notify you  of labs when available. If all ok then can chjeck spc in 2 years   Healthy lifestyle includes : At least 150 minutes of exercise weeks  , weight at healthy levels, which is usually   BMI 19-25. Avoid trans fats and processed foods;  Increase fresh fruits and veges to 5 servings per day. And avoid sweet beverages including tea and juice. Mediterranean diet with olive oil and nuts have been noted to be heart and brain healthy . Avoid tobacco products . Limit  alcohol to  7 per week for women and 14 servings for men.  Get adequate sleep . Wear seat belts . Don't text and drive .   Health Maintenance, Female Adopting a healthy lifestyle and getting preventive care can go a long way to promote health and wellness. Talk with your health care provider about what schedule of regular examinations is right for you. This is a good chance for you to check in with your provider about disease prevention and staying healthy. In between checkups, there are plenty of things you can do on your own. Experts have done a lot of research about which lifestyle changes and preventive measures are most likely to keep you healthy. Ask your health care provider for more information. Weight and diet Eat a healthy diet  Be sure to include plenty of vegetables, fruits, low-fat dairy products, and lean protein.  Do not eat a lot of foods high in solid fats, added sugars, or salt.  Get regular exercise. This is one of the most important things you can do for your health. ? Most adults should exercise for at least 150 minutes each week. The exercise should increase your heart rate and make you sweat (moderate-intensity exercise). ? Most adults should also do strengthening exercises at least twice a week. This is in addition to the moderate-intensity exercise.  Maintain a healthy weight  Body mass index (BMI) is  a measurement that can be used to identify possible weight problems. It estimates body fat based on height and weight. Your health care provider can help determine your BMI and help you achieve or maintain a healthy weight.  For females 29 years of age and older: ? A BMI below 18.5 is considered underweight. ? A BMI of 18.5 to 24.9 is normal. ? A BMI of 25 to 29.9 is considered overweight. ? A BMI of 30 and above is considered obese.  Watch levels of cholesterol and blood lipids  You should start having your blood tested for lipids and cholesterol at 26 years of age, then have this test every 5 years.  You may need to have your cholesterol levels checked more often if: ? Your lipid or cholesterol levels are high. ? You are older than 26 years of age. ? You are at high risk for heart disease.  Cancer screening Lung Cancer  Lung cancer screening is recommended for adults 70-69 years old who are at high risk for lung cancer because of a history of smoking.  A yearly low-dose CT scan of the lungs is recommended for people who: ? Currently smoke. ? Have quit within the past 15 years. ? Have at least a 30-pack-year history of smoking. A pack year is smoking an average of one pack of cigarettes a day for 1 year.  Yearly screening should continue until it has been 15  years since you quit.  Yearly screening should stop if you develop a health problem that would prevent you from having lung cancer treatment.  Breast Cancer  Practice breast self-awareness. This means understanding how your breasts normally appear and feel.  It also means doing regular breast self-exams. Let your health care provider know about any changes, no matter how small.  If you are in your 20s or 30s, you should have a clinical breast exam (CBE) by a health care provider every 1-3 years as part of a regular health exam.  If you are 71 or older, have a CBE every year. Also consider having a breast X-ray (mammogram)  every year.  If you have a family history of breast cancer, talk to your health care provider about genetic screening.  If you are at high risk for breast cancer, talk to your health care provider about having an MRI and a mammogram every year.  Breast cancer gene (BRCA) assessment is recommended for women who have family members with BRCA-related cancers. BRCA-related cancers include: ? Breast. ? Ovarian. ? Tubal. ? Peritoneal cancers.  Results of the assessment will determine the need for genetic counseling and BRCA1 and BRCA2 testing.  Cervical Cancer Your health care provider may recommend that you be screened regularly for cancer of the pelvic organs (ovaries, uterus, and vagina). This screening involves a pelvic examination, including checking for microscopic changes to the surface of your cervix (Pap test). You may be encouraged to have this screening done every 3 years, beginning at age 8.  For women ages 76-65, health care providers may recommend pelvic exams and Pap testing every 3 years, or they may recommend the Pap and pelvic exam, combined with testing for human papilloma virus (HPV), every 5 years. Some types of HPV increase your risk of cervical cancer. Testing for HPV may also be done on women of any age with unclear Pap test results.  Other health care providers may not recommend any screening for nonpregnant women who are considered low risk for pelvic cancer and who do not have symptoms. Ask your health care provider if a screening pelvic exam is right for you.  If you have had past treatment for cervical cancer or a condition that could lead to cancer, you need Pap tests and screening for cancer for at least 20 years after your treatment. If Pap tests have been discontinued, your risk factors (such as having a new sexual partner) need to be reassessed to determine if screening should resume. Some women have medical problems that increase the chance of getting cervical  cancer. In these cases, your health care provider may recommend more frequent screening and Pap tests.  Colorectal Cancer  This type of cancer can be detected and often prevented.  Routine colorectal cancer screening usually begins at 26 years of age and continues through 26 years of age.  Your health care provider may recommend screening at an earlier age if you have risk factors for colon cancer.  Your health care provider may also recommend using home test kits to check for hidden blood in the stool.  A small camera at the end of a tube can be used to examine your colon directly (sigmoidoscopy or colonoscopy). This is done to check for the earliest forms of colorectal cancer.  Routine screening usually begins at age 58.  Direct examination of the colon should be repeated every 5-10 years through 26 years of age. However, you may need to be screened more often  if early forms of precancerous polyps or small growths are found.  Skin Cancer  Check your skin from head to toe regularly.  Tell your health care provider about any new moles or changes in moles, especially if there is a change in a mole's shape or color.  Also tell your health care provider if you have a mole that is larger than the size of a pencil eraser.  Always use sunscreen. Apply sunscreen liberally and repeatedly throughout the day.  Protect yourself by wearing long sleeves, pants, a wide-brimmed hat, and sunglasses whenever you are outside.  Heart disease, diabetes, and high blood pressure  High blood pressure causes heart disease and increases the risk of stroke. High blood pressure is more likely to develop in: ? People who have blood pressure in the high end of the normal range (130-139/85-89 mm Hg). ? People who are overweight or obese. ? People who are African American.  If you are 70-73 years of age, have your blood pressure checked every 3-5 years. If you are 47 years of age or older, have your blood  pressure checked every year. You should have your blood pressure measured twice-once when you are at a hospital or clinic, and once when you are not at a hospital or clinic. Record the average of the two measurements. To check your blood pressure when you are not at a hospital or clinic, you can use: ? An automated blood pressure machine at a pharmacy. ? A home blood pressure monitor.  If you are between 65 years and 8 years old, ask your health care provider if you should take aspirin to prevent strokes.  Have regular diabetes screenings. This involves taking a blood sample to check your fasting blood sugar level. ? If you are at a normal weight and have a low risk for diabetes, have this test once every three years after 26 years of age. ? If you are overweight and have a high risk for diabetes, consider being tested at a younger age or more often. Preventing infection Hepatitis B  If you have a higher risk for hepatitis B, you should be screened for this virus. You are considered at high risk for hepatitis B if: ? You were born in a country where hepatitis B is common. Ask your health care provider which countries are considered high risk. ? Your parents were born in a high-risk country, and you have not been immunized against hepatitis B (hepatitis B vaccine). ? You have HIV or AIDS. ? You use needles to inject street drugs. ? You live with someone who has hepatitis B. ? You have had sex with someone who has hepatitis B. ? You get hemodialysis treatment. ? You take certain medicines for conditions, including cancer, organ transplantation, and autoimmune conditions.  Hepatitis C  Blood testing is recommended for: ? Everyone born from [birth year] through 1964. ? Anyone with known risk factors for hepatitis C.  Sexually transmitted infections (STIs)  You should be screened for sexually transmitted infections (STIs) including gonorrhea and chlamydia if: ? You are sexually active and are  younger than 26 years of age. ? You are older than 26 years of age and your health care provider tells you that you are at risk for this type of infection. ? Your sexual activity has changed since you were last screened and you are at an increased risk for chlamydia or gonorrhea. Ask your health care provider if you are at risk.  If you do not  have HIV, but are at risk, it may be recommended that you take a prescription medicine daily to prevent HIV infection. This is called pre-exposure prophylaxis (PrEP). You are considered at risk if: ? You are sexually active and do not regularly use condoms or know the HIV status of your partner(s). ? You take drugs by injection. ? You are sexually active with a partner who has HIV.  Talk with your health care provider about whether you are at high risk of being infected with HIV. If you choose to begin PrEP, you should first be tested for HIV. You should then be tested every 3 months for as long as you are taking PrEP. Pregnancy  If you are premenopausal and you may become pregnant, ask your health care provider about preconception counseling.  If you may become pregnant, take 400 to 800 micrograms (mcg) of folic acid every day.  If you want to prevent pregnancy, talk to your health care provider about birth control (contraception). Osteoporosis and menopause  Osteoporosis is a disease in which the bones lose minerals and strength with aging. This can result in serious bone fractures. Your risk for osteoporosis can be identified using a bone density scan.  If you are 10 years of age or older, or if you are at risk for osteoporosis and fractures, ask your health care provider if you should be screened.  Ask your health care provider whether you should take a calcium or vitamin D supplement to lower your risk for osteoporosis.  Menopause may have certain physical symptoms and risks.  Hormone replacement therapy may reduce some of these symptoms and  risks. Talk to your health care provider about whether hormone replacement therapy is right for you. Follow these instructions at home:  Schedule regular health, dental, and eye exams.  Stay current with your immunizations.  Do not use any tobacco products including cigarettes, chewing tobacco, or electronic cigarettes.  If you are pregnant, do not drink alcohol.  If you are breastfeeding, limit how much and how often you drink alcohol.  Limit alcohol intake to no more than 1 drink per day for nonpregnant women. One drink equals 12 ounces of beer, 5 ounces of wine, or 1 ounces of hard liquor.  Do not use street drugs.  Do not share needles.  Ask your health care provider for help if you need support or information about quitting drugs.  Tell your health care provider if you often feel depressed.  Tell your health care provider if you have ever been abused or do not feel safe at home. This information is not intended to replace advice given to you by your health care provider. Make sure you discuss any questions you have with your health care provider. Document Released: 03/01/2011 Document Revised: 01/22/2016 Document Reviewed: 05/20/2015 Elsevier Interactive Patient Education  [employer].

## 2018-05-04 NOTE — Telephone Encounter (Signed)
Please advise Dr Fabian Sharp, thanks.    Recent Results (from the past 2160 hour(s))  Lipid panel     Status: None   Collection Time: 05/03/18 11:46 AM  Result Value Ref Range   Cholesterol 161 0 - 200 mg/dL    Comment: ATP III Classification       Desirable:  < 200 mg/dL               Borderline High:  200 - 239 mg/dL          High:  > = 741 mg/dL   Triglycerides 63.8 0.0 - 149.0 mg/dL    Comment: Normal:  <453 mg/dLBorderline High:  150 - 199 mg/dL   HDL 64.68 >03.21 mg/dL   VLDL 22.4 0.0 - 82.5 mg/dL   LDL Cholesterol 99 0 - 99 mg/dL   Total CHOL/HDL Ratio 4     Comment:                Men          Women1/2 Average Risk     3.4          3.3Average Risk          5.0          4.42X Average Risk          9.6          7.13X Average Risk          15.0          11.0                       NonHDL 117.38     Comment: NOTE:  Non-HDL goal should be 30 mg/dL higher than patient's LDL goal (i.e. LDL goal of < 70 mg/dL, would have non-HDL goal of < 100 mg/dL)  Glucose, Random     Status: None   Collection Time: 05/03/18 11:46 AM  Result Value Ref Range   Glucose, Bld 86 70 - 99 mg/dL

## 2018-09-07 NOTE — Telephone Encounter (Signed)
Please give her information for our therapists [place].  To begin with  Colen DarlingLisa flores etc  If unable to get in then ask again

## 2019-04-05 ENCOUNTER — Other Ambulatory Visit: Payer: Self-pay

## 2019-04-05 ENCOUNTER — Telehealth (INDEPENDENT_AMBULATORY_CARE_PROVIDER_SITE_OTHER): Payer: 59 | Admitting: Family Medicine

## 2019-04-05 DIAGNOSIS — R05 Cough: Secondary | ICD-10-CM | POA: Diagnosis not present

## 2019-04-05 DIAGNOSIS — R059 Cough, unspecified: Secondary | ICD-10-CM

## 2019-04-05 MED ORDER — ALBUTEROL SULFATE HFA 108 (90 BASE) MCG/ACT IN AERS: 2.0000 | INHALATION_SPRAY | Freq: Four times a day (QID) | RESPIRATORY_TRACT | 0 refills | Status: AC | PRN

## 2019-04-05 MED ORDER — BENZONATATE 100 MG PO CAPS: 100.0000 mg | ORAL_CAPSULE | Freq: Three times a day (TID) | ORAL | 0 refills | Status: DC | PRN

## 2019-04-05 NOTE — Progress Notes (Signed)
Virtual Visit via Video Note  I connected with Bailey Reyes  on 04/05/19 at  4:40 PM EDT by a video enabled telemedicine application and verified that I am speaking with the correct person using two identifiers.  Location patient: home Location provider:work or home office Persons participating in the virtual visit: patient, provider  I discussed the limitations of evaluation and management by telemedicine and the availability of in person appointments. The patient expressed understanding and agreed to proceed.   HPI:  Acute visit for Cough: -started having symptoms about 2 weeks ago -symptoms include a bad cough, mild sob at times, improving a little now, but not gone, a little runny nose, sore throat -doing herbal tea and cough drops -she used seventh generation cleaner right before this started when cleaning her bathroom -hx of mild exercise asthma remotely, but no issues recently -denies fevers, body aches, NVD, rashes, loss of taste or smell -no sick contacts -she has been very isolated - she and her husband have been working from home and have been locked in their home for months   ROS: See pertinent positives and negatives per HPI.  Past Medical History:  Diagnosis Date  . ADD (attention deficit disorder)    dx in college  sx since grade school had ha with med    . Exercise-induced asthma    emprici dx  given  inhaler prn no current use    Past Surgical History:  Procedure Laterality Date  . TONSILLECTOMY AND ADENOIDECTOMY  2011    Family History  Problem Relation Age of Onset  . Rheum arthritis Maternal Grandmother        on     SOCIAL HX: see hpi   Current Outpatient Medications:  .  albuterol (PROAIR HFA) 108 (90 Base) MCG/ACT inhaler, Inhale 2 puffs into the lungs every 6 (six) hours as needed for wheezing or shortness of breath., Disp: 8.5 g, Rfl: 0 .  benzonatate (TESSALON PERLES) 100 MG capsule, Take 1 capsule (100 mg total) by mouth 3 (three) times daily as  needed., Disp: 20 capsule, Rfl: 0 .  Norgestimate-Ethinyl Estradiol Triphasic (TRI-SPRINTEC) 0.18/0.215/0.25 MG-35 MCG tablet, Take 1 tablet by mouth daily., Disp: , Rfl:   EXAM:  VITALS per patient if applicable: no fever  GENERAL: alert, oriented, appears well and in no acute distress  HEENT: atraumatic, conjunttiva clear, no obvious abnormalities on inspection of external nose and ears  NECK: normal movements of the head and neck  LUNGS: on inspection no signs of respiratory distress, breathing rate appears normal, no obvious gross SOB, gasping or wheezing  CV: no obvious cyanosis  MS: moves all visible extremities without noticeable abnormality  PSYCH/NEURO: pleasant and cooperative, no obvious depression or anxiety, speech and thought processing grossly intact  ASSESSMENT AND PLAN:  Discussed the following assessment and plan:  Cough  -we discussed possible serious and likely etiologies, workup and treatment, treatment risks and return precautions. She feels certain this is a reaction to the cleaner - that may be possible if she has underlying mild asthma, this may have been a trigger for a mild asthma exacerbation. Discussed other potential etiologies and infectious possibilities including COVID, but seems less likely given the measures they have taken. -after this discussion, Bailey Reyes opted for since improving a trial of prn albuterol, tessalon for cough. Avoid exposure to that cleaner and cleaners in general without lots of ventilation if possible.  -follow up advised if symptoms worsen or persist or new concerns arise.She agreed.  Lucretia Kern, DO

## 2019-04-23 ENCOUNTER — Other Ambulatory Visit: Payer: Self-pay

## 2019-04-23 ENCOUNTER — Ambulatory Visit (INDEPENDENT_AMBULATORY_CARE_PROVIDER_SITE_OTHER): Payer: 59 | Admitting: Internal Medicine

## 2019-04-23 ENCOUNTER — Encounter: Payer: Self-pay | Admitting: Internal Medicine

## 2019-04-23 DIAGNOSIS — Z8709 Personal history of other diseases of the respiratory system: Secondary | ICD-10-CM | POA: Diagnosis not present

## 2019-04-23 DIAGNOSIS — R0689 Other abnormalities of breathing: Secondary | ICD-10-CM

## 2019-04-23 DIAGNOSIS — R0600 Dyspnea, unspecified: Secondary | ICD-10-CM

## 2019-04-23 DIAGNOSIS — J9801 Acute bronchospasm: Secondary | ICD-10-CM

## 2019-04-23 DIAGNOSIS — R05 Cough: Secondary | ICD-10-CM

## 2019-04-23 DIAGNOSIS — R059 Cough, unspecified: Secondary | ICD-10-CM

## 2019-04-23 MED ORDER — BUDESONIDE-FORMOTEROL FUMARATE 160-4.5 MCG/ACT IN AERO: 2.0000 | INHALATION_SPRAY | Freq: Two times a day (BID) | RESPIRATORY_TRACT | 1 refills | Status: DC

## 2019-04-23 NOTE — Progress Notes (Signed)
Virtual Visit via Video Note  I connected with@ on 04/23/19 at  8:45 AM EDT by a video enabled telemedicine application and verified that I am speaking with the correct person using two identifiers. Location patient: home Location provider:work office Persons participating in the virtual visit: patient, provider  [place] national recommendations  regarding COVID 19 pandemic   video visit is advised over in office visit for this patient.  Patient aware  of the limitations of evaluation and management by telemedicine and  availability of in person appointments. and agreed to proceed.   HPI: Bailey Reyes presents for video visit She saw  Vv dr Selena BattenKim  For resp sx  And still not totally gone  About July 19 she had the acute onset of coughing and feeling short of breath after working with a cleaner aerosolized in her bathroom.  She stopped using it but had some persistent cough after that.  Since that time her cough is significantly improved in fact just about gone however she is having episodes where she feels like she cannot catch her breath and has lots of yawning. She has a remote history of possible exercise-induced asthma and allergy from which she has improved over age. She is very stressed out and feels that could be adding to the situation. She does have a monitor that gives her pulse and activity and sleep.  Pulse is generally in the 70s but occasionally has gone up to 119. She has no nocturnal symptoms. She has good days and bad days and will use the albuterol 2 puffs twice a day seems to help but is scared to not use it.  She has been pretty restricted and isolated since the COVID shot down does not think she has been exposed to any other significant her husband was cleaning the car with some type of spray and she had possibly recurrence of symptoms felt short of breath at that time.  Denies runny nose or itching.    ROS: See pertinent positives and negatives per HPI.  Past  Medical History:  Diagnosis Date  . ADD (attention deficit disorder)    dx in college  sx since grade school had ha with med    . Exercise-induced asthma    emprici dx  given  inhaler prn no current use    Past Surgical History:  Procedure Laterality Date  . TONSILLECTOMY AND ADENOIDECTOMY  2011    Family History  Problem Relation Age of Onset  . Rheum arthritis Maternal Grandmother        on     Social History   Tobacco Use  . Smoking status: Never Smoker  . Smokeless tobacco: Never Used  Substance Use Topics  . Alcohol use: Yes    Alcohol/week: 0.0 standard drinks    Comment: average 2 drinks weekly  . Drug use: No      Current Outpatient Medications:  .  albuterol (PROAIR HFA) 108 (90 Base) MCG/ACT inhaler, Inhale 2 puffs into the lungs every 6 (six) hours as needed for wheezing or shortness of breath., Disp: 8.5 g, Rfl: 0 .  benzonatate (TESSALON PERLES) 100 MG capsule, Take 1 capsule (100 mg total) by mouth 3 (three) times daily as needed., Disp: 20 capsule, Rfl: 0 .  budesonide-formoterol (SYMBICORT) 160-4.5 MCG/ACT inhaler, Inhale 2 puffs into the lungs 2 (two) times daily., Disp: 1 Inhaler, Rfl: 1 .  Norgestimate-Ethinyl Estradiol Triphasic (TRI-SPRINTEC) 0.18/0.215/0.25 MG-35 MCG tablet, Take 1 tablet by mouth daily., Disp: , Rfl:  EXAM: BP Readings from Last 3 Encounters:  05/03/18 98/70  01/17/18 106/68  04/19/17 100/78    VITALS per patient if applicable: No cough or respiratory distress during visit.  Color is good.  GENERAL: alert, oriented, appears well and in no acute distress HEENT: atraumatic, conjunttiva clear, no obvious abnormalities on inspection of external nose and ears NECK: normal movements of the head and neck  LUNGS: on inspection no signs of respiratory distress, breathing rate appears normal, no obvious gross SOB, gasping or wheezing CV: no obvious cyanosis  MS: moves all visible extremities without noticeable  abnormality  PSYCH/NEURO: pleasant and cooperative, mildly anxious, speech and thought processing grossly intact Lab Results  Component Value Date   WBC 6.8 01/17/2018   HGB 12.1 01/17/2018   HCT 36.3 01/17/2018   PLT 276.0 01/17/2018   GLUCOSE 86 05/03/2018   CHOL 161 05/03/2018   TRIG 91.0 05/03/2018   HDL 44.10 05/03/2018   LDLCALC 99 05/03/2018   ALT 18 01/17/2018   AST 20 01/17/2018   NA 140 01/17/2018   K 4.1 01/17/2018   CL 105 01/17/2018   CREATININE 0.79 01/17/2018   BUN 11 01/17/2018   CO2 26 01/17/2018   TSH 2.82 01/17/2018   HGBA1C 5.8 01/17/2018    ASSESSMENT AND PLAN:  Discussed the following assessment and plan:    ICD-10-CM   1. Dyspnea, unspecified type  R06.00   2. Cough  R05   3. Bronchospasm  J98.01   4. History of extrinsic asthma   EIA  Z87.09   5. Yawning  R06.89    Suspect combo of anxiety and reactive airway after initial  Aerosolized  insults and underlying  hx of poss  eia and allergies that she has "grown out of"   Plus  Anxiety.   reassurance but no nocturnal sx and has good days and bad days    Cough is gone but sob episodes return  But not alarming   Low  covid risk based on hx   Suspect yawing may be anxiety?  No meds  Rd etc   Add controller inhaler empirically and then rov vv or other in 3-4 weeks reassurance about residual sx  consider other eval exam cxray pfts etc if ongoing  Counseled.   Expectant management and discussion of plan and treatment with opportunity to ask questions and all were answered. The patient agreed with the plan and demonstrated an understanding of the instructions.   Advised to call back or seek an in-person evaluation if worsening  or having  further concerns .  Shanon Ace, MD

## 2019-05-08 ENCOUNTER — Ambulatory Visit (INDEPENDENT_AMBULATORY_CARE_PROVIDER_SITE_OTHER): Payer: 59 | Admitting: Psychology

## 2019-05-08 DIAGNOSIS — F411 Generalized anxiety disorder: Secondary | ICD-10-CM

## 2019-05-17 ENCOUNTER — Ambulatory Visit (INDEPENDENT_AMBULATORY_CARE_PROVIDER_SITE_OTHER): Payer: 59 | Admitting: Psychology

## 2019-05-17 DIAGNOSIS — F411 Generalized anxiety disorder: Secondary | ICD-10-CM

## 2019-05-24 ENCOUNTER — Ambulatory Visit: Payer: 59 | Admitting: Psychology

## 2019-06-05 ENCOUNTER — Ambulatory Visit (INDEPENDENT_AMBULATORY_CARE_PROVIDER_SITE_OTHER): Payer: 59 | Admitting: Psychology

## 2019-06-05 DIAGNOSIS — F411 Generalized anxiety disorder: Secondary | ICD-10-CM | POA: Diagnosis not present

## 2019-06-19 ENCOUNTER — Ambulatory Visit (INDEPENDENT_AMBULATORY_CARE_PROVIDER_SITE_OTHER): Payer: 59 | Admitting: Psychology

## 2019-06-19 DIAGNOSIS — F411 Generalized anxiety disorder: Secondary | ICD-10-CM | POA: Diagnosis not present

## 2019-07-03 ENCOUNTER — Ambulatory Visit (INDEPENDENT_AMBULATORY_CARE_PROVIDER_SITE_OTHER): Payer: 59 | Admitting: Psychology

## 2019-07-03 DIAGNOSIS — F411 Generalized anxiety disorder: Secondary | ICD-10-CM | POA: Diagnosis not present

## 2019-07-17 ENCOUNTER — Ambulatory Visit (INDEPENDENT_AMBULATORY_CARE_PROVIDER_SITE_OTHER): Payer: 59 | Admitting: Psychology

## 2019-07-17 DIAGNOSIS — F411 Generalized anxiety disorder: Secondary | ICD-10-CM | POA: Diagnosis not present

## 2019-07-31 ENCOUNTER — Ambulatory Visit (INDEPENDENT_AMBULATORY_CARE_PROVIDER_SITE_OTHER): Payer: 59 | Admitting: Psychology

## 2019-07-31 DIAGNOSIS — F411 Generalized anxiety disorder: Secondary | ICD-10-CM | POA: Diagnosis not present

## 2019-08-14 ENCOUNTER — Ambulatory Visit (INDEPENDENT_AMBULATORY_CARE_PROVIDER_SITE_OTHER): Payer: 59 | Admitting: Psychology

## 2019-08-14 DIAGNOSIS — F411 Generalized anxiety disorder: Secondary | ICD-10-CM | POA: Diagnosis not present

## 2019-08-28 ENCOUNTER — Ambulatory Visit (INDEPENDENT_AMBULATORY_CARE_PROVIDER_SITE_OTHER): Payer: 59 | Admitting: Psychology

## 2019-08-28 DIAGNOSIS — F411 Generalized anxiety disorder: Secondary | ICD-10-CM

## 2019-09-11 ENCOUNTER — Ambulatory Visit (INDEPENDENT_AMBULATORY_CARE_PROVIDER_SITE_OTHER): Payer: Self-pay | Admitting: Psychology

## 2019-09-11 DIAGNOSIS — F411 Generalized anxiety disorder: Secondary | ICD-10-CM

## 2019-09-25 ENCOUNTER — Ambulatory Visit (INDEPENDENT_AMBULATORY_CARE_PROVIDER_SITE_OTHER): Payer: Self-pay | Admitting: Psychology

## 2019-09-25 DIAGNOSIS — F411 Generalized anxiety disorder: Secondary | ICD-10-CM

## 2019-10-09 ENCOUNTER — Ambulatory Visit (INDEPENDENT_AMBULATORY_CARE_PROVIDER_SITE_OTHER): Payer: Self-pay | Admitting: Psychology

## 2019-10-09 DIAGNOSIS — F411 Generalized anxiety disorder: Secondary | ICD-10-CM

## 2019-10-10 ENCOUNTER — Ambulatory Visit: Payer: Self-pay | Admitting: Psychology

## 2019-10-30 ENCOUNTER — Ambulatory Visit (INDEPENDENT_AMBULATORY_CARE_PROVIDER_SITE_OTHER): Payer: BC Managed Care – PPO | Admitting: Psychology

## 2019-10-30 DIAGNOSIS — F411 Generalized anxiety disorder: Secondary | ICD-10-CM | POA: Diagnosis not present

## 2019-11-13 ENCOUNTER — Ambulatory Visit (INDEPENDENT_AMBULATORY_CARE_PROVIDER_SITE_OTHER): Payer: BC Managed Care – PPO | Admitting: Psychology

## 2019-11-13 DIAGNOSIS — F411 Generalized anxiety disorder: Secondary | ICD-10-CM | POA: Diagnosis not present

## 2019-11-27 ENCOUNTER — Ambulatory Visit (INDEPENDENT_AMBULATORY_CARE_PROVIDER_SITE_OTHER): Payer: BC Managed Care – PPO | Admitting: Psychology

## 2019-11-27 DIAGNOSIS — F411 Generalized anxiety disorder: Secondary | ICD-10-CM | POA: Diagnosis not present

## 2019-12-04 ENCOUNTER — Ambulatory Visit (INDEPENDENT_AMBULATORY_CARE_PROVIDER_SITE_OTHER): Payer: BC Managed Care – PPO | Admitting: Psychology

## 2019-12-04 DIAGNOSIS — F411 Generalized anxiety disorder: Secondary | ICD-10-CM

## 2019-12-11 ENCOUNTER — Ambulatory Visit (INDEPENDENT_AMBULATORY_CARE_PROVIDER_SITE_OTHER): Payer: BC Managed Care – PPO | Admitting: Psychology

## 2019-12-11 DIAGNOSIS — F411 Generalized anxiety disorder: Secondary | ICD-10-CM | POA: Diagnosis not present

## 2019-12-26 ENCOUNTER — Ambulatory Visit (INDEPENDENT_AMBULATORY_CARE_PROVIDER_SITE_OTHER): Payer: BC Managed Care – PPO | Admitting: Psychology

## 2019-12-26 DIAGNOSIS — F411 Generalized anxiety disorder: Secondary | ICD-10-CM

## 2020-01-15 ENCOUNTER — Ambulatory Visit (INDEPENDENT_AMBULATORY_CARE_PROVIDER_SITE_OTHER): Payer: BC Managed Care – PPO | Admitting: Psychology

## 2020-01-15 DIAGNOSIS — F411 Generalized anxiety disorder: Secondary | ICD-10-CM

## 2020-02-06 ENCOUNTER — Ambulatory Visit (INDEPENDENT_AMBULATORY_CARE_PROVIDER_SITE_OTHER): Payer: BC Managed Care – PPO | Admitting: Psychology

## 2020-02-06 DIAGNOSIS — F411 Generalized anxiety disorder: Secondary | ICD-10-CM | POA: Diagnosis not present

## 2020-02-27 ENCOUNTER — Ambulatory Visit: Payer: BC Managed Care – PPO | Admitting: Psychology

## 2020-03-07 ENCOUNTER — Ambulatory Visit (INDEPENDENT_AMBULATORY_CARE_PROVIDER_SITE_OTHER): Payer: BC Managed Care – PPO | Admitting: Psychology

## 2020-03-07 DIAGNOSIS — F411 Generalized anxiety disorder: Secondary | ICD-10-CM | POA: Diagnosis not present

## 2020-03-20 ENCOUNTER — Ambulatory Visit (INDEPENDENT_AMBULATORY_CARE_PROVIDER_SITE_OTHER): Payer: BC Managed Care – PPO | Admitting: Psychology

## 2020-03-20 DIAGNOSIS — F411 Generalized anxiety disorder: Secondary | ICD-10-CM

## 2020-04-10 ENCOUNTER — Ambulatory Visit: Payer: BC Managed Care – PPO | Admitting: Psychology

## 2020-04-11 ENCOUNTER — Ambulatory Visit: Payer: BC Managed Care – PPO | Admitting: Internal Medicine

## 2020-04-11 DIAGNOSIS — Z0289 Encounter for other administrative examinations: Secondary | ICD-10-CM

## 2020-04-11 NOTE — Progress Notes (Deleted)
No chief complaint on file.   HPI: Bailey Reyes 29 y.o. come in for  ROS: See pertinent positives and negatives per HPI.  Past Medical History:  Diagnosis Date  . ADD (attention deficit disorder)    dx in college  sx since grade school had ha with med    . Exercise-induced asthma    emprici dx  given  inhaler prn no current use    Family History  Problem Relation Age of Onset  . Rheum arthritis Maternal Grandmother        on     Social History   Socioeconomic History  . Marital status: Married    Spouse name: Not on file  . Number of children: Not on file  . Years of education: Not on file  . Highest education level: Not on file  Occupational History  . Not on file  Tobacco Use  . Smoking status: Never Smoker  . Smokeless tobacco: Never Used  Vaping Use  . Vaping Use: Never used  Substance and Sexual Activity  . Alcohol use: Yes    Alcohol/week: 0.0 standard drinks    Comment: average 2 drinks weekly  . Drug use: No  . Sexual activity: Not on file  Other Topics Concern  . Not on file  Social History Narrative   From [place] lives with bf   [employer] graduate    [employer]  Employed    Raised by gm since age 32 years   Dx adhd college    G0P0   Social Determinants of Health   Financial Resource Strain:   . Difficulty of Paying Living Expenses:   Food Insecurity:   . Worried About Programme researcher, broadcasting/film/video in the Last Year:   . Barista in the Last Year:   Transportation Needs:   . Freight forwarder (Medical):   Marland Kitchen Lack of Transportation (Non-Medical):   Physical Activity:   . Days of Exercise per Week:   . Minutes of Exercise per Session:   Stress:   . Feeling of Stress :   Social Connections:   . Frequency of Communication with Friends and Family:   . Frequency of Social Gatherings with Friends and Family:   . Attends Religious Services:   . Active Member of Clubs or Organizations:   . Attends Banker Meetings:   Marland Kitchen  Marital Status:     Outpatient Medications Prior to Visit  Medication Sig Dispense Refill  . albuterol (PROAIR HFA) 108 (90 Base) MCG/ACT inhaler Inhale 2 puffs into the lungs every 6 (six) hours as needed for wheezing or shortness of breath. 8.5 g 0  . benzonatate (TESSALON PERLES) 100 MG capsule Take 1 capsule (100 mg total) by mouth 3 (three) times daily as needed. 20 capsule 0  . budesonide-formoterol (SYMBICORT) 160-4.5 MCG/ACT inhaler Inhale 2 puffs into the lungs 2 (two) times daily. 1 Inhaler 1  . Norgestimate-Ethinyl Estradiol Triphasic (TRI-SPRINTEC) 0.18/0.215/0.25 MG-35 MCG tablet Take 1 tablet by mouth daily.     No facility-administered medications prior to visit.     EXAM:  There were no vitals taken for this visit.  There is no height or weight on file to calculate BMI.  GENERAL: vitals reviewed and listed above, alert, oriented, appears well hydrated and in no acute distress HEENT: atraumatic, conjunctiva  clear, no obvious abnormalities on inspection of external nose and ears OP : no lesion edema or exudate  NECK: no obvious masses on  inspection palpation  LUNGS: clear to auscultation bilaterally, no wheezes, rales or rhonchi, good air movement CV: HRRR, no clubbing cyanosis or  peripheral edema nl cap refill  MS: moves all extremities without noticeable focal  abnormality PSYCH: pleasant and cooperative, no obvious depression or anxiety Lab Results  Component Value Date   WBC 6.8 01/17/2018   HGB 12.1 01/17/2018   HCT 36.3 01/17/2018   PLT 276.0 01/17/2018   GLUCOSE 86 05/03/2018   CHOL 161 05/03/2018   TRIG 91.0 05/03/2018   HDL 44.10 05/03/2018   LDLCALC 99 05/03/2018   ALT 18 01/17/2018   AST 20 01/17/2018   NA 140 01/17/2018   K 4.1 01/17/2018   CL 105 01/17/2018   CREATININE 0.79 01/17/2018   BUN 11 01/17/2018   CO2 26 01/17/2018   TSH 2.82 01/17/2018   HGBA1C 5.8 01/17/2018   BP Readings from Last 3 Encounters:  05/03/18 98/70  01/17/18  106/68  04/19/17 100/78    ASSESSMENT AND PLAN:  Discussed the following assessment and plan:  No diagnosis found.  -Patient advised to return or notify health care team  if  new concerns arise.  There are no Patient Instructions on file for this visit.   Neta Mends. Belal Scallon M.D.

## 2020-04-16 ENCOUNTER — Ambulatory Visit: Payer: BC Managed Care – PPO | Admitting: Internal Medicine

## 2020-04-23 DIAGNOSIS — Z01419 Encounter for gynecological examination (general) (routine) without abnormal findings: Secondary | ICD-10-CM | POA: Diagnosis not present

## 2020-04-23 DIAGNOSIS — Z6828 Body mass index (BMI) 28.0-28.9, adult: Secondary | ICD-10-CM | POA: Diagnosis not present

## 2020-04-23 DIAGNOSIS — Z124 Encounter for screening for malignant neoplasm of cervix: Secondary | ICD-10-CM | POA: Diagnosis not present

## 2020-04-23 DIAGNOSIS — Z1151 Encounter for screening for human papillomavirus (HPV): Secondary | ICD-10-CM | POA: Diagnosis not present

## 2020-04-23 LAB — HM PAP SMEAR

## 2020-05-12 ENCOUNTER — Ambulatory Visit (INDEPENDENT_AMBULATORY_CARE_PROVIDER_SITE_OTHER): Payer: Self-pay | Admitting: Psychology

## 2020-05-12 DIAGNOSIS — F411 Generalized anxiety disorder: Secondary | ICD-10-CM

## 2020-05-29 ENCOUNTER — Ambulatory Visit (INDEPENDENT_AMBULATORY_CARE_PROVIDER_SITE_OTHER): Payer: Self-pay | Admitting: Psychology

## 2020-05-29 DIAGNOSIS — F419 Anxiety disorder, unspecified: Secondary | ICD-10-CM

## 2020-05-30 ENCOUNTER — Other Ambulatory Visit: Payer: Self-pay

## 2020-05-30 ENCOUNTER — Emergency Department (HOSPITAL_COMMUNITY)
Admission: EM | Admit: 2020-05-30 | Discharge: 2020-05-30 | Disposition: A | Discharge status: Home / Self Care | Payer: BC Managed Care – PPO | Attending: Emergency Medicine | Admitting: Emergency Medicine

## 2020-05-30 ENCOUNTER — Emergency Department (HOSPITAL_COMMUNITY): Payer: BC Managed Care – PPO

## 2020-05-30 ENCOUNTER — Encounter (HOSPITAL_COMMUNITY): Payer: Self-pay

## 2020-05-30 DIAGNOSIS — S20212A Contusion of left front wall of thorax, initial encounter: Secondary | ICD-10-CM | POA: Diagnosis not present

## 2020-05-30 DIAGNOSIS — R1030 Lower abdominal pain, unspecified: Secondary | ICD-10-CM | POA: Insufficient documentation

## 2020-05-30 DIAGNOSIS — S4992XA Unspecified injury of left shoulder and upper arm, initial encounter: Secondary | ICD-10-CM | POA: Diagnosis present

## 2020-05-30 DIAGNOSIS — S42022A Displaced fracture of shaft of left clavicle, initial encounter for closed fracture: Secondary | ICD-10-CM

## 2020-05-30 DIAGNOSIS — Y92828 Other wilderness area as the place of occurrence of the external cause: Secondary | ICD-10-CM | POA: Diagnosis not present

## 2020-05-30 DIAGNOSIS — Y9389 Activity, other specified: Secondary | ICD-10-CM | POA: Diagnosis not present

## 2020-05-30 LAB — COMPREHENSIVE METABOLIC PANEL
ALT: 17 U/L (ref 0–44)
AST: 20 U/L (ref 15–41)
Albumin: 4.2 g/dL (ref 3.5–5.0)
Alkaline Phosphatase: 60 U/L (ref 38–126)
Anion gap: 11 (ref 5–15)
BUN: 13 mg/dL (ref 6–20)
CO2: 21 mmol/L — ABNORMAL LOW (ref 22–32)
Calcium: 9.4 mg/dL (ref 8.9–10.3)
Chloride: 103 mmol/L (ref 98–111)
Creatinine, Ser: 0.64 mg/dL (ref 0.44–1.00)
GFR calc Af Amer: >60 mL/min (ref >60)
GFR calc non Af Amer: >60 mL/min (ref >60)
Glucose, Bld: 115 mg/dL — ABNORMAL HIGH (ref 70–99)
Potassium: 3.5 mmol/L (ref 3.5–5.1)
Sodium: 135 mmol/L (ref 135–145)
Total Bilirubin: 0.6 mg/dL (ref 0.3–1.2)
Total Protein: 7.9 g/dL (ref 6.5–8.1)

## 2020-05-30 LAB — CBC WITH DIFFERENTIAL/PLATELET
Abs Immature Granulocytes: 0.04 10*3/uL (ref 0.00–0.07)
Basophils Absolute: 0.1 10*3/uL (ref 0.0–0.1)
Basophils Relative: 0 %
Eosinophils Absolute: 0 10*3/uL (ref 0.0–0.5)
Eosinophils Relative: 0 %
HCT: 38 % (ref 36.0–46.0)
Hemoglobin: 12.9 g/dL (ref 12.0–15.0)
Immature Granulocytes: 0 %
Lymphocytes Relative: 11 %
Lymphs Abs: 1.4 10*3/uL (ref 0.7–4.0)
MCH: 29.6 pg (ref 26.0–34.0)
MCHC: 33.9 g/dL (ref 30.0–36.0)
MCV: 87.2 fL (ref 80.0–100.0)
Monocytes Absolute: 0.6 10*3/uL (ref 0.1–1.0)
Monocytes Relative: 4 %
Neutro Abs: 11.5 10*3/uL — ABNORMAL HIGH (ref 1.7–7.7)
Neutrophils Relative %: 85 %
Platelets: 286 10*3/uL (ref 150–400)
RBC: 4.36 MIL/uL (ref 3.87–5.11)
RDW: 12.8 % (ref 11.5–15.5)
WBC: 13.6 10*3/uL — ABNORMAL HIGH (ref 4.0–10.5)
nRBC: 0 % (ref 0.0–0.2)

## 2020-05-30 LAB — I-STAT BETA HCG BLOOD, ED (MC, WL, AP ONLY): I-stat hCG, quantitative: <5 m[IU]/mL (ref <5)

## 2020-05-30 LAB — LIPASE, BLOOD: Lipase: 27 U/L (ref 11–51)

## 2020-05-30 MED ORDER — MORPHINE SULFATE (PF) 4 MG/ML IV SOLN
4.0000 mg | Freq: Once | INTRAVENOUS | Status: AC
  Administered 2020-05-30: 4 mg via INTRAVENOUS
  Filled 2020-05-30: qty 1

## 2020-05-30 MED ORDER — OXYCODONE-ACETAMINOPHEN 5-325 MG PO TABS: 1.0000 | ORAL_TABLET | Freq: Four times a day (QID) | ORAL | 0 refills | Status: DC | PRN

## 2020-05-30 MED ORDER — SODIUM CHLORIDE (PF) 0.9 % IJ SOLN
INTRAMUSCULAR | Status: AC
  Filled 2020-05-30: qty 50

## 2020-05-30 MED ORDER — ONDANSETRON HCL 4 MG/2ML IJ SOLN
4.0000 mg | Freq: Once | INTRAMUSCULAR | Status: AC
  Administered 2020-05-30: 4 mg via INTRAVENOUS
  Filled 2020-05-30: qty 2

## 2020-05-30 MED ORDER — IOHEXOL 300 MG/ML  SOLN
100.0000 mL | Freq: Once | INTRAMUSCULAR | Status: AC | PRN
  Administered 2020-05-30: 100 mL via INTRAVENOUS

## 2020-05-30 MED ORDER — OXYCODONE-ACETAMINOPHEN 5-325 MG PO TABS
1.0000 | ORAL_TABLET | Freq: Once | ORAL | Status: AC
  Administered 2020-05-30: 1 via ORAL
  Filled 2020-05-30: qty 1

## 2020-05-30 NOTE — ED Notes (Signed)
To CT
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
## 2020-06-03 ENCOUNTER — Other Ambulatory Visit (HOSPITAL_COMMUNITY)
Admission: RE | Admit: 2020-06-03 | Discharge: 2020-06-03 | Disposition: A | Discharge status: Home / Self Care | Payer: BC Managed Care – PPO | Source: Ambulatory Visit | Attending: Orthopaedic Surgery | Admitting: Orthopaedic Surgery

## 2020-06-03 ENCOUNTER — Other Ambulatory Visit: Payer: Self-pay

## 2020-06-03 ENCOUNTER — Encounter (HOSPITAL_BASED_OUTPATIENT_CLINIC_OR_DEPARTMENT_OTHER): Payer: Self-pay | Admitting: Orthopaedic Surgery

## 2020-06-03 DIAGNOSIS — Z01812 Encounter for preprocedural laboratory examination: Secondary | ICD-10-CM | POA: Diagnosis not present

## 2020-06-03 DIAGNOSIS — Z20822 Contact with and (suspected) exposure to covid-19: Secondary | ICD-10-CM | POA: Insufficient documentation

## 2020-06-03 LAB — SARS CORONAVIRUS 2 (TAT 6-24 HRS): SARS Coronavirus 2: NEGATIVE
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
## 2020-06-05 ENCOUNTER — Ambulatory Visit (HOSPITAL_BASED_OUTPATIENT_CLINIC_OR_DEPARTMENT_OTHER): Payer: BC Managed Care – PPO | Admitting: Certified Registered"

## 2020-06-05 ENCOUNTER — Encounter (HOSPITAL_BASED_OUTPATIENT_CLINIC_OR_DEPARTMENT_OTHER)
Admission: RE | Disposition: A | Discharge status: Home / Self Care | Payer: Self-pay | Source: Ambulatory Visit | Attending: Orthopaedic Surgery

## 2020-06-05 ENCOUNTER — Ambulatory Visit (HOSPITAL_BASED_OUTPATIENT_CLINIC_OR_DEPARTMENT_OTHER)
Admission: RE | Admit: 2020-06-05 | Discharge: 2020-06-05 | Disposition: A | Discharge status: Home / Self Care | Payer: BC Managed Care – PPO | Source: Ambulatory Visit | Attending: Orthopaedic Surgery | Admitting: Orthopaedic Surgery

## 2020-06-05 ENCOUNTER — Encounter (HOSPITAL_BASED_OUTPATIENT_CLINIC_OR_DEPARTMENT_OTHER): Payer: Self-pay | Admitting: Orthopaedic Surgery

## 2020-06-05 ENCOUNTER — Ambulatory Visit (HOSPITAL_COMMUNITY): Payer: BC Managed Care – PPO

## 2020-06-05 ENCOUNTER — Other Ambulatory Visit: Payer: Self-pay

## 2020-06-05 DIAGNOSIS — Y92410 Unspecified street and highway as the place of occurrence of the external cause: Secondary | ICD-10-CM | POA: Insufficient documentation

## 2020-06-05 DIAGNOSIS — S42002A Fracture of unspecified part of left clavicle, initial encounter for closed fracture: Secondary | ICD-10-CM | POA: Insufficient documentation

## 2020-06-05 DIAGNOSIS — T148XXA Other injury of unspecified body region, initial encounter: Secondary | ICD-10-CM

## 2020-06-05 DIAGNOSIS — J45990 Exercise induced bronchospasm: Secondary | ICD-10-CM | POA: Insufficient documentation

## 2020-06-05 HISTORY — PX: ORIF CLAVICULAR FRACTURE: SHX5055

## 2020-06-05 LAB — POCT PREGNANCY, URINE: Preg Test, Ur: NEGATIVE

## 2020-06-05 SURGERY — OPEN REDUCTION INTERNAL FIXATION (ORIF) CLAVICULAR FRACTURE
Anesthesia: General | Site: Neck | Laterality: Left

## 2020-06-05 MED ORDER — CEFAZOLIN SODIUM-DEXTROSE 2-4 GM/100ML-% IV SOLN
2.0000 g | INTRAVENOUS | Status: AC
  Administered 2020-06-05: 2 g via INTRAVENOUS

## 2020-06-05 MED ORDER — ACETAMINOPHEN 500 MG PO TABS: 1000.0000 mg | ORAL_TABLET | Freq: Three times a day (TID) | ORAL | 0 refills | Status: AC

## 2020-06-05 MED ORDER — ONDANSETRON HCL 4 MG PO TABS: 4.0000 mg | ORAL_TABLET | Freq: Three times a day (TID) | ORAL | 1 refills | Status: AC | PRN

## 2020-06-05 MED ORDER — FENTANYL CITRATE (PF) 100 MCG/2ML IJ SOLN
INTRAMUSCULAR | Status: AC
  Filled 2020-06-05: qty 2

## 2020-06-05 MED ORDER — LIDOCAINE HCL (CARDIAC) PF 100 MG/5ML IV SOSY
PREFILLED_SYRINGE | INTRAVENOUS | Status: DC | PRN
  Administered 2020-06-05: 60 mg via INTRAVENOUS

## 2020-06-05 MED ORDER — PROPOFOL 10 MG/ML IV BOLUS
INTRAVENOUS | Status: AC
  Filled 2020-06-05: qty 20

## 2020-06-05 MED ORDER — PROMETHAZINE HCL 25 MG/ML IJ SOLN: 6.2500 mg | INTRAMUSCULAR | Status: DC | PRN

## 2020-06-05 MED ORDER — MELOXICAM 7.5 MG PO TABS: 7.5000 mg | ORAL_TABLET | Freq: Every day | ORAL | 0 refills | Status: AC

## 2020-06-05 MED ORDER — BUPIVACAINE HCL (PF) 0.25 % IJ SOLN
INTRAMUSCULAR | Status: AC
  Filled 2020-06-05: qty 30

## 2020-06-05 MED ORDER — MIDAZOLAM HCL 2 MG/2ML IJ SOLN
INTRAMUSCULAR | Status: AC
  Filled 2020-06-05: qty 2

## 2020-06-05 MED ORDER — VANCOMYCIN HCL 1000 MG IV SOLR
INTRAVENOUS | Status: DC | PRN
  Administered 2020-06-05: 1000 mg

## 2020-06-05 MED ORDER — DEXAMETHASONE SODIUM PHOSPHATE 10 MG/ML IJ SOLN
INTRAMUSCULAR | Status: DC | PRN
  Administered 2020-06-05: 5 mg via INTRAVENOUS

## 2020-06-05 MED ORDER — CEFAZOLIN SODIUM-DEXTROSE 2-4 GM/100ML-% IV SOLN
INTRAVENOUS | Status: AC
  Filled 2020-06-05: qty 100

## 2020-06-05 MED ORDER — LIDOCAINE 2% (20 MG/ML) 5 ML SYRINGE
INTRAMUSCULAR | Status: AC
  Filled 2020-06-05: qty 5

## 2020-06-05 MED ORDER — OXYCODONE HCL 5 MG/5ML PO SOLN: 5.0000 mg | Freq: Once | ORAL | Status: AC | PRN

## 2020-06-05 MED ORDER — MIDAZOLAM HCL 5 MG/5ML IJ SOLN
INTRAMUSCULAR | Status: DC | PRN
  Administered 2020-06-05: 2 mg via INTRAVENOUS

## 2020-06-05 MED ORDER — AMISULPRIDE (ANTIEMETIC) 5 MG/2ML IV SOLN: 10.0000 mg | Freq: Once | INTRAVENOUS | Status: DC | PRN

## 2020-06-05 MED ORDER — OXYCODONE HCL 5 MG PO TABS
5.0000 mg | ORAL_TABLET | Freq: Once | ORAL | Status: AC | PRN
  Administered 2020-06-05: 5 mg via ORAL

## 2020-06-05 MED ORDER — ONDANSETRON HCL 4 MG/2ML IJ SOLN
INTRAMUSCULAR | Status: DC | PRN
  Administered 2020-06-05: 4 mg via INTRAVENOUS

## 2020-06-05 MED ORDER — DEXAMETHASONE SODIUM PHOSPHATE 10 MG/ML IJ SOLN
INTRAMUSCULAR | Status: AC
  Filled 2020-06-05: qty 1

## 2020-06-05 MED ORDER — FENTANYL CITRATE (PF) 100 MCG/2ML IJ SOLN
INTRAMUSCULAR | Status: DC | PRN
  Administered 2020-06-05 (×6): 25 ug via INTRAVENOUS
  Administered 2020-06-05: 50 ug via INTRAVENOUS

## 2020-06-05 MED ORDER — OXYCODONE HCL 5 MG PO TABS
ORAL_TABLET | ORAL | 0 refills | Status: AC
Start: 2020-06-05 — End: 2020-06-10

## 2020-06-05 MED ORDER — OXYCODONE HCL 5 MG PO TABS
ORAL_TABLET | ORAL | Status: AC
Start: 2020-06-05 — End: ?
  Filled 2020-06-05: qty 1

## 2020-06-05 MED ORDER — MEPERIDINE HCL 25 MG/ML IJ SOLN: 6.2500 mg | INTRAMUSCULAR | Status: DC | PRN

## 2020-06-05 MED ORDER — ONDANSETRON HCL 4 MG/2ML IJ SOLN
INTRAMUSCULAR | Status: AC
  Filled 2020-06-05: qty 2

## 2020-06-05 MED ORDER — LACTATED RINGERS IV SOLN: INTRAVENOUS | Status: DC

## 2020-06-05 MED ORDER — HYDROMORPHONE HCL 1 MG/ML IJ SOLN
0.2500 mg | INTRAMUSCULAR | Status: DC | PRN
  Administered 2020-06-05 (×2): 0.5 mg via INTRAVENOUS

## 2020-06-05 MED ORDER — HYDROMORPHONE HCL 1 MG/ML IJ SOLN
INTRAMUSCULAR | Status: AC
  Filled 2020-06-05: qty 0.5

## 2020-06-05 MED ORDER — PROPOFOL 10 MG/ML IV BOLUS
INTRAVENOUS | Status: DC | PRN
  Administered 2020-06-05: 200 mg via INTRAVENOUS

## 2020-06-05 MED ORDER — HYDROMORPHONE HCL 1 MG/ML IJ SOLN
INTRAMUSCULAR | Status: AC
Start: 2020-06-05 — End: ?
  Filled 2020-06-05: qty 0.5

## 2020-06-05 MED ORDER — BUPIVACAINE HCL (PF) 0.25 % IJ SOLN
INTRAMUSCULAR | Status: DC | PRN
  Administered 2020-06-05: 20 mL

## 2020-06-05 SURGICAL SUPPLY — 66 items
AID PSTN UNV HD RSTRNT DISP (MISCELLANEOUS) ×1
APL PRP STRL LF DISP 70% ISPRP (MISCELLANEOUS) ×1
BIT DRILL 2 CANN GRADUATED (BIT) ×2 IMPLANT
BIT DRILL 2.5 CANN ENDOSCOPIC (BIT) ×2 IMPLANT
BIT DRILL 2.7 (BIT) ×2
BIT DRILL 2.7X2.7/3XSCR ANKL (BIT) ×1 IMPLANT
BIT DRL 2.7X2.7/3XSCR ANKL (BIT) ×1
BLADE HEX COATED 2.75 (ELECTRODE) IMPLANT
BLADE SURG 10 STRL SS (BLADE) ×2 IMPLANT
BLADE SURG 15 STRL LF DISP TIS (BLADE) ×1 IMPLANT
BLADE SURG 15 STRL SS (BLADE) ×2
BNDG COHESIVE 4X5 TAN STRL (GAUZE/BANDAGES/DRESSINGS) IMPLANT
CHLORAPREP W/TINT 26 (MISCELLANEOUS) ×2 IMPLANT
CLSR STERI-STRIP ANTIMIC 1/2X4 (GAUZE/BANDAGES/DRESSINGS) ×2 IMPLANT
COOLER ICEMAN CLASSIC (MISCELLANEOUS) ×2 IMPLANT
COVER WAND RF STERILE (DRAPES) IMPLANT
DECANTER SPIKE VIAL GLASS SM (MISCELLANEOUS) IMPLANT
DRAPE C-ARM 42X72 X-RAY (DRAPES) ×2 IMPLANT
DRAPE IMP U-DRAPE 54X76 (DRAPES) ×2 IMPLANT
DRAPE INCISE IOBAN 66X45 STRL (DRAPES) ×2 IMPLANT
DRAPE U-SHAPE 76X120 STRL (DRAPES) ×4 IMPLANT
DRIVER KREULOCK 2.7 (BIT) ×2 IMPLANT
DRIVER LONG (BIT) ×2 IMPLANT
DRSG AQUACEL AG ADV 3.5X 6 (GAUZE/BANDAGES/DRESSINGS) ×2 IMPLANT
ELECT REM PT RETURN 9FT ADLT (ELECTROSURGICAL) ×2
ELECTRODE REM PT RTRN 9FT ADLT (ELECTROSURGICAL) ×1 IMPLANT
GLOVE BIO SURGEON STRL SZ 6.5 (GLOVE) ×2 IMPLANT
GLOVE BIO SURGEON STRL SZ8 (GLOVE) IMPLANT
GLOVE BIOGEL PI IND STRL 6.5 (GLOVE) ×1 IMPLANT
GLOVE BIOGEL PI IND STRL 8 (GLOVE) ×1 IMPLANT
GLOVE BIOGEL PI INDICATOR 6.5 (GLOVE) ×1
GLOVE BIOGEL PI INDICATOR 8 (GLOVE) ×1
GLOVE ECLIPSE 8.0 STRL XLNG CF (GLOVE) ×2 IMPLANT
GOWN STRL REUS W/ TWL LRG LVL3 (GOWN DISPOSABLE) ×1 IMPLANT
GOWN STRL REUS W/TWL LRG LVL3 (GOWN DISPOSABLE) ×2
GOWN STRL REUS W/TWL XL LVL3 (GOWN DISPOSABLE) ×2 IMPLANT
KIT STABILIZATION SHOULDER (MISCELLANEOUS) ×2 IMPLANT
PACK ARTHROSCOPY DSU (CUSTOM PROCEDURE TRAY) ×2 IMPLANT
PACK BASIN DAY SURGERY FS (CUSTOM PROCEDURE TRAY) ×2 IMPLANT
PAD COLD SHLDR WRAP-ON (PAD) ×2 IMPLANT
PENCIL SMOKE EVACUATOR (MISCELLANEOUS) ×2 IMPLANT
PLATE CLAV FX 3RD LT (Plate) ×2 IMPLANT
RESTRAINT HEAD UNIVERSAL NS (MISCELLANEOUS) ×2 IMPLANT
SCREW LOW PROFILE 3.5X14 (Screw) ×4 IMPLANT
SCREW LOW PROFILE 3.5X16 (Screw) ×6 IMPLANT
SCREW NLOCK T15 FT 18X3.5XST (Screw) ×1 IMPLANT
SCREW NON LOCK 3.5X18MM (Screw) ×2 IMPLANT
SCREW TM SS 2.7X14 CORTEX (Screw) ×2 IMPLANT
SHEET MEDIUM DRAPE 40X70 STRL (DRAPES) ×2 IMPLANT
SLEEVE SCD COMPRESS KNEE MED (MISCELLANEOUS) ×2 IMPLANT
SLING ARM FOAM STRAP LRG (SOFTGOODS) IMPLANT
SPONGE LAP 18X18 RF (DISPOSABLE) ×2 IMPLANT
SPONGE LAP 4X18 RFD (DISPOSABLE) IMPLANT
SUCTION FRAZIER HANDLE 10FR (MISCELLANEOUS) ×1
SUCTION TUBE FRAZIER 10FR DISP (MISCELLANEOUS) ×1 IMPLANT
SUT MNCRL AB 4-0 PS2 18 (SUTURE) IMPLANT
SUT VIC AB 0 CT1 18XCR BRD 8 (SUTURE) ×1 IMPLANT
SUT VIC AB 0 CT1 27 (SUTURE)
SUT VIC AB 0 CT1 27XCR 8 STRN (SUTURE) IMPLANT
SUT VIC AB 0 CT1 8-18 (SUTURE) ×2
SUT VIC AB 0 SH 27 (SUTURE) IMPLANT
SUT VIC AB 3-0 SH 27 (SUTURE) ×2
SUT VIC AB 3-0 SH 27X BRD (SUTURE) ×1 IMPLANT
SYR BULB EAR ULCER 3OZ GRN STR (SYRINGE) ×2 IMPLANT
TOWEL GREEN STERILE FF (TOWEL DISPOSABLE) ×2 IMPLANT
YANKAUER SUCT BULB TIP NO VENT (SUCTIONS) ×2 IMPLANT

## 2020-06-05 NOTE — Anesthesia Procedure Notes (Signed)
Procedure Name: LMA Insertion Date/Time: 06/05/2020 1:37 PM Performed by: Lauralyn Primes, CRNA Pre-anesthesia Checklist: Patient identified, Emergency Drugs available, Suction available and Patient being monitored Patient Re-evaluated:Patient Re-evaluated prior to induction Oxygen Delivery Method: Circle system utilized Preoxygenation: Pre-oxygenation with 100% oxygen Induction Type: IV induction Ventilation: Mask ventilation without difficulty LMA: LMA inserted LMA Size: 4.0 Number of attempts: 1 Airway Equipment and Method: Bite block Placement Confirmation: positive ETCO2 Tube secured with: Tape Dental Injury: Teeth and Oropharynx as per pre-operative assessment

## 2020-06-05 NOTE — Transfer of Care (Signed)
Immediate Anesthesia Transfer of Care Note  Patient: Bailey Reyes  Procedure(s) Performed: OPEN REDUCTION INTERNAL FIXATION (ORIF) CLAVICULAR FRACTURE (Left Neck)  Patient Location: PACU  Anesthesia Type:General  Level of Consciousness: awake, alert  and oriented  Airway & Oxygen Therapy: Patient Spontanous Breathing and Patient connected to face mask oxygen  Post-op Assessment: Report given to RN and Post -op Vital signs reviewed and stable  Post vital signs: Reviewed and stable  Last Vitals:  Vitals Value Taken Time  BP 122/105 06/05/20 1447  Temp    Pulse 74 06/05/20 1449  Resp 14 06/05/20 1449  SpO2 100 % 06/05/20 1449  Vitals shown include unvalidated device data.  Last Pain:  Vitals:   06/05/20 1158  TempSrc: Oral  PainSc: 3          Complications: No complications documented.

## 2020-06-05 NOTE — Interval H&P Note (Signed)
History and Physical Interval Note:  06/05/2020 11:48 AM  Bailey Reyes  has presented today for surgery, with the diagnosis of LEFT CLAVICLE FRACTURE.  The various methods of treatment have been discussed with the patient and family. After consideration of risks, benefits and other options for treatment, the patient has consented to  Procedure(s): OPEN REDUCTION INTERNAL FIXATION (ORIF) CLAVICULAR FRACTURE (Left) as a surgical intervention.  The patient's history has been reviewed, patient examined, no change in status, stable for surgery.  I have reviewed the patient's chart and labs.  Questions were answered to the patient's satisfaction.     Bjorn Pippin

## 2020-06-05 NOTE — Discharge Instructions (Signed)
  Post Anesthesia Home Care Instructions  Activity: Get plenty of rest for the remainder of the day. A responsible individual must stay with you for 24 hours following the procedure.  For the next 24 hours, DO NOT: -Drive a car -Advertising copywriter -Drink alcoholic beverages -Take any medication unless instructed by your physician -Make any legal decisions or sign important papers.  Meals: Start with liquid foods such as gelatin or soup. Progress to regular foods as tolerated. Avoid greasy, spicy, heavy foods. If nausea and/or vomiting occur, drink only clear liquids until the nausea and/or vomiting subsides. Call your physician if vomiting continues.  Special Instructions/Symptoms: Your throat may feel dry or sore from the anesthesia or the breathing tube placed in your throat during surgery. If this causes discomfort, gargle with warm salt water. The discomfort should disappear within 24 hours.  If you had a scopolamine patch placed behind your ear for the management of post- operative nausea and/or vomiting:  1. The medication in the patch is effective for 72 hours, after which it should be removed.  Wrap patch in a tissue and discard in the trash. Wash hands thoroughly with soap and water. 2. You may remove the patch earlier than 72 hours if you experience unpleasant side effects which may include dry mouth, dizziness or visual disturbances. 3. Avoid touching the patch. Wash your hands with soap and water after contact with the patch.  Do not take Oxycodone until 10:30 pm.

## 2020-06-05 NOTE — Anesthesia Postprocedure Evaluation (Signed)
Anesthesia Post Note  Patient: Bailey Reyes  Procedure(s) Performed: OPEN REDUCTION INTERNAL FIXATION (ORIF) CLAVICULAR FRACTURE (Left Neck)     Patient location during evaluation: PACU Anesthesia Type: General Level of consciousness: awake and alert Pain management: pain level controlled Vital Signs Assessment: post-procedure vital signs reviewed and stable Respiratory status: spontaneous breathing, nonlabored ventilation, respiratory function stable and patient connected to nasal cannula oxygen Cardiovascular status: blood pressure returned to baseline and stable Postop Assessment: no apparent nausea or vomiting Anesthetic complications: no   No complications documented.  Last Vitals:  Vitals:   06/05/20 1530 06/05/20 1545  BP: 134/89 (!) 126/91  Pulse: 79 67  Resp: (!) 26 15  Temp:    SpO2: 100% 95%    Last Pain:  Vitals:   06/05/20 1545  TempSrc:   PainSc: Asleep                 Darrill Vreeland COKER

## 2020-06-05 NOTE — Anesthesia Preprocedure Evaluation (Signed)
Anesthesia Evaluation  Patient identified by MRN, date of birth, ID band Patient awake    Reviewed: Allergy & Precautions, NPO status , Patient's Chart, lab work & pertinent test results  Airway Mallampati: II  TM Distance: >3 FB Neck ROM: Full    Dental no notable dental hx.    Pulmonary asthma ,    Pulmonary exam normal breath sounds clear to auscultation       Cardiovascular negative cardio ROS Normal cardiovascular exam Rhythm:Regular Rate:Normal     Neuro/Psych negative neurological ROS  negative psych ROS   GI/Hepatic negative GI ROS, Neg liver ROS,   Endo/Other  negative endocrine ROS  Renal/GU negative Renal ROS  negative genitourinary   Musculoskeletal negative musculoskeletal ROS (+)   Abdominal   Peds negative pediatric ROS (+)  Hematology negative hematology ROS (+)   Anesthesia Other Findings   Reproductive/Obstetrics negative OB ROS                             Anesthesia Physical Anesthesia Plan  ASA: II  Anesthesia Plan: General   Post-op Pain Management:    Induction: Intravenous  PONV Risk Score and Plan: 3 and Ondansetron, Dexamethasone, Midazolam and Treatment may vary due to age or medical condition  Airway Management Planned: LMA  Additional Equipment:   Intra-op Plan:   Post-operative Plan: Extubation in OR  Informed Consent: I have reviewed the patients History and Physical, chart, labs and discussed the procedure including the risks, benefits and alternatives for the proposed anesthesia with the patient or authorized representative who has indicated his/her understanding and acceptance.     Dental advisory given  Plan Discussed with: CRNA  Anesthesia Plan Comments:         Anesthesia Quick Evaluation  

## 2020-06-06 NOTE — Op Note (Signed)
Orthopaedic Surgery Operative Note (CSN: [id number])  Bailey Reyes  Apr 15, 1991 Date of Surgery: 06/05/2020   Diagnoses:  LEFT CLAVICLE FRACTURE  Procedure: Left clavicle open reduction fixation    Operative Finding Successful completion of the planned procedure.  Good fixation overall with 1 lag screw.  Good bone quality but a rather small clavicle.  Post-operative plan: The patient will be nonweightbearing in a sling for 3 weeks and limited range of motion for the first 6 total.  The patient will be discharged home.  DVT prophylaxis not indicated in this ambulatory upper extremity patient without significant risk factors.   Pain control with PRN pain medication preferring oral medicines.  Follow up plan will be scheduled in approximately 7 days for incision check and XR.  Post-Op Diagnosis: Same Surgeons:Primary: Bjorn Pippin, MD Assistants:Caroline McBane PA-C Location: [facility] OR ROOM 6 Anesthesia: General with local anesthesia Antibiotics: Ancef 2 g with local vancomycin powder 1 g at the surgical site Tourniquet time: * No tourniquets in log * Estimated Blood Loss: Minimal Complications: None Specimens: None Implants: Implant Name Type Inv. Item Serial No. Manufacturer Lot No. LRB No. Used Action  SCREW TM SS 2.7X14 CORTEX - [id number] Screw SCREW TM SS 2.7X14 CORTEX  [employer]  Left 1 Implanted  Plate    [employer]  Left 1 Implanted  SCREW LOW PROFILE 3.5X14 - [id number] Screw SCREW LOW PROFILE 3.5X14  [employer]  Left 2 Implanted  SCREW LOW PROFILE 3.5X16 - [id number] Screw SCREW LOW PROFILE 3.5X16  [employer]  Left 3 Implanted  SCREW NON LOCK 3.5X18MM - [id number] Screw SCREW NON LOCK 3.5X18MM  [employer]  Left 1 Implanted    Indications for Surgery:   Bailey Reyes is a 29 y.o. female with clavicle fracture resulting in initially nondisplaced fracture however displaced on secondary x-rays.  Benefits and risks of operative and nonoperative management were  discussed prior to surgery with patient/guardian(s) and informed consent form was completed.  Specific risks including infection, need for additional surgery, nonunion, malunion, periincisional numbness and hardware failure amongst others   Procedure:   The patient was identified properly. Informed consent was obtained and the surgical site was marked. The patient was taken up to suite where general anesthesia was induced.  The patient was positioned beachchair on a Allen table with a spider arm positioner.  The left shoulder was prepped and draped in the usual sterile fashion.  Timeout was performed before the beginning of the case.  The fracture site was palpated along the clavicle and a linear incision along the anterior border of the clavicle was made through the skin sharply. We then used Bovie electrocautery to achieve careful hemostasis.  At that point we made small full-thickness flaps and identified the superior border of the clavicle and used the Bovie to create full-thickness muscular flaps on the superior border of the clavicle out of plane with our incision.  Cleared periosteum at the fracture site and were able to identify the entirety of the fracture site.  We a currette to sharply debride the fracture and remove any retained callus or hematoma. We then used a series of clamps to achieve an anatomic reduction.  We used a 2 7 lag screw by technique to hold her reduction.  We then placed a superior clavicle plate specific to the right side on the fracture noting appropriate alignment.  We are able to get 6 cortices but on each side of the fracture site.  Good bone quality.  We are very happy with the final reduction was checked on final fluoroscopic images.  The incision was irrigated copiously and free from debris and any foreign body.  The deep muscular layer was closed with interrupted figure-of-eight Vicryl sutures watertight.  Skin was closed in a multilayer fashion with absorbable  sutures.  A sterile dressing and sling were placed and the patient was awoken and taken to PACU in stable condition.  Alfonse Alpers, PA-C, present and scrubbed throughout the case, critical for completion in a timely fashion, and for retraction, instrumentation, closure.

## 2020-06-11 ENCOUNTER — Encounter (HOSPITAL_BASED_OUTPATIENT_CLINIC_OR_DEPARTMENT_OTHER): Payer: Self-pay | Admitting: Orthopaedic Surgery

## 2020-06-24 ENCOUNTER — Ambulatory Visit (INDEPENDENT_AMBULATORY_CARE_PROVIDER_SITE_OTHER): Payer: BC Managed Care – PPO | Admitting: Psychology

## 2020-06-24 DIAGNOSIS — F411 Generalized anxiety disorder: Secondary | ICD-10-CM | POA: Diagnosis not present

## 2020-07-10 ENCOUNTER — Ambulatory Visit (INDEPENDENT_AMBULATORY_CARE_PROVIDER_SITE_OTHER): Payer: BC Managed Care – PPO | Admitting: Psychology

## 2020-07-10 DIAGNOSIS — F4310 Post-traumatic stress disorder, unspecified: Secondary | ICD-10-CM | POA: Diagnosis not present

## 2020-07-17 ENCOUNTER — Ambulatory Visit: Payer: BC Managed Care – PPO | Admitting: Psychology

## 2020-07-23 ENCOUNTER — Ambulatory Visit (INDEPENDENT_AMBULATORY_CARE_PROVIDER_SITE_OTHER): Payer: BC Managed Care – PPO | Admitting: Psychology

## 2020-07-23 DIAGNOSIS — F4310 Post-traumatic stress disorder, unspecified: Secondary | ICD-10-CM | POA: Diagnosis not present

## 2020-07-31 DIAGNOSIS — M25512 Pain in left shoulder: Secondary | ICD-10-CM | POA: Diagnosis not present

## 2020-07-31 DIAGNOSIS — S42022D Displaced fracture of shaft of left clavicle, subsequent encounter for fracture with routine healing: Secondary | ICD-10-CM | POA: Diagnosis not present

## 2020-07-31 DIAGNOSIS — M25612 Stiffness of left shoulder, not elsewhere classified: Secondary | ICD-10-CM | POA: Diagnosis not present

## 2020-07-31 DIAGNOSIS — M6281 Muscle weakness (generalized): Secondary | ICD-10-CM | POA: Diagnosis not present

## 2020-08-11 ENCOUNTER — Ambulatory Visit (INDEPENDENT_AMBULATORY_CARE_PROVIDER_SITE_OTHER): Payer: BC Managed Care – PPO | Admitting: Psychology

## 2020-08-11 DIAGNOSIS — F4310 Post-traumatic stress disorder, unspecified: Secondary | ICD-10-CM

## 2020-08-18 ENCOUNTER — Ambulatory Visit (INDEPENDENT_AMBULATORY_CARE_PROVIDER_SITE_OTHER): Payer: BC Managed Care – PPO | Admitting: Psychology

## 2020-08-18 DIAGNOSIS — F4310 Post-traumatic stress disorder, unspecified: Secondary | ICD-10-CM

## 2020-09-02 DIAGNOSIS — M25512 Pain in left shoulder: Secondary | ICD-10-CM | POA: Diagnosis not present

## 2020-10-08 DIAGNOSIS — K122 Cellulitis and abscess of mouth: Secondary | ICD-10-CM | POA: Diagnosis not present

## 2020-10-08 DIAGNOSIS — J029 Acute pharyngitis, unspecified: Secondary | ICD-10-CM | POA: Diagnosis not present

## 2020-10-08 DIAGNOSIS — R52 Pain, unspecified: Secondary | ICD-10-CM | POA: Diagnosis not present

## 2020-10-08 DIAGNOSIS — R6883 Chills (without fever): Secondary | ICD-10-CM | POA: Diagnosis not present

## 2020-10-08 DIAGNOSIS — Z20822 Contact with and (suspected) exposure to covid-19: Secondary | ICD-10-CM | POA: Diagnosis not present

## 2020-11-25 ENCOUNTER — Other Ambulatory Visit: Payer: Self-pay | Admitting: Orthopaedic Surgery

## 2020-11-25 DIAGNOSIS — M25512 Pain in left shoulder: Secondary | ICD-10-CM

## 2020-11-27 ENCOUNTER — Ambulatory Visit
Admission: RE | Admit: 2020-11-27 | Discharge: 2020-11-27 | Disposition: A | Discharge status: Home / Self Care | Payer: BC Managed Care – PPO | Source: Ambulatory Visit | Attending: Orthopaedic Surgery | Admitting: Orthopaedic Surgery

## 2020-11-27 DIAGNOSIS — M25512 Pain in left shoulder: Secondary | ICD-10-CM | POA: Diagnosis not present

## 2020-12-03 ENCOUNTER — Other Ambulatory Visit: Payer: Self-pay

## 2020-12-03 ENCOUNTER — Encounter (HOSPITAL_BASED_OUTPATIENT_CLINIC_OR_DEPARTMENT_OTHER): Payer: Self-pay | Admitting: Orthopaedic Surgery

## 2020-12-08 ENCOUNTER — Other Ambulatory Visit (HOSPITAL_COMMUNITY)
Admission: RE | Admit: 2020-12-08 | Discharge: 2020-12-08 | Disposition: A | Discharge status: Home / Self Care | Payer: BC Managed Care – PPO | Source: Ambulatory Visit | Attending: Orthopaedic Surgery | Admitting: Orthopaedic Surgery

## 2020-12-08 DIAGNOSIS — Y831 Surgical operation with implant of artificial internal device as the cause of abnormal reaction of the patient, or of later complication, without mention of misadventure at the time of the procedure: Secondary | ICD-10-CM | POA: Diagnosis not present

## 2020-12-08 DIAGNOSIS — T8484XA Pain due to internal orthopedic prosthetic devices, implants and grafts, initial encounter: Secondary | ICD-10-CM | POA: Diagnosis not present

## 2020-12-08 DIAGNOSIS — Z20822 Contact with and (suspected) exposure to covid-19: Secondary | ICD-10-CM | POA: Insufficient documentation

## 2020-12-08 DIAGNOSIS — Z01812 Encounter for preprocedural laboratory examination: Secondary | ICD-10-CM | POA: Insufficient documentation

## 2020-12-08 DIAGNOSIS — Z793 Long term (current) use of hormonal contraceptives: Secondary | ICD-10-CM | POA: Diagnosis not present

## 2020-12-08 LAB — SARS CORONAVIRUS 2 (TAT 6-24 HRS): SARS Coronavirus 2: NEGATIVE

## 2020-12-08 NOTE — H&P (Signed)
PREOPERATIVE H&P  Chief Complaint: COMPLICATION FO ORTHOPEDIC DEVICES  HPI: [name] is a 30 y.o. female who is scheduled for, Procedure(s): REMOVAL HARDWARE.   Patient is a healthy 30 year old who has an open reduction internal fixation of left clavicle fracture on 06/05/2020. The patient has symptoms from her plate.  She occasionally has sharp pain over her clavicle.  No shoulder pain reported.    Her symptoms are rated as moderate to severe, and have been worsening.  This is significantly impairing activities of daily living.    Please see clinic note for further details on this patient's care.    She has elected for surgical management.   Past Medical History:  Diagnosis Date  . ADD (attention deficit disorder)    dx in college  sx since grade school had ha with med    . Exercise-induced asthma    emprici dx  given  inhaler prn no current use   Past Surgical History:  Procedure Laterality Date  . ORIF CLAVICULAR FRACTURE Left 06/05/2020   Procedure: OPEN REDUCTION INTERNAL FIXATION (ORIF) CLAVICULAR FRACTURE;  Surgeon: Bjorn Pippin, MD;  Location: [facility];  Service: Orthopedics;  Laterality: Left;  . TONSILLECTOMY AND ADENOIDECTOMY  2011   Social History   Socioeconomic History  . Marital status: Married    Spouse name: Not on file  . Number of children: Not on file  . Years of education: Not on file  . Highest education level: Not on file  Occupational History  . Not on file  Tobacco Use  . Smoking status: Never Smoker  . Smokeless tobacco: Never Used  Vaping Use  . Vaping Use: Never used  Substance and Sexual Activity  . Alcohol use: Yes    Alcohol/week: 0.0 standard drinks    Comment: average 2 drinks weekly  . Drug use: No  . Sexual activity: Not on file  Other Topics Concern  . Not on file  Social History Narrative  . Not on file   Social Determinants of Health   Financial Resource Strain: Not on file  Food  Insecurity: Not on file  Transportation Needs: Not on file  Physical Activity: Not on file  Stress: Not on file  Social Connections: Not on file   Family History  Problem Relation Age of Onset  . Rheum arthritis Maternal Grandmother        on   . Healthy Mother   . Healthy Father    No Known Allergies Prior to Admission medications   Medication Sig Start Date End Date Taking? Authorizing Provider  Norgestimate-Ethinyl Estradiol Triphasic 0.18/0.215/0.25 MG-35 MCG tablet Take 1 tablet by mouth daily.   Yes [provider]  albuterol (PROAIR HFA) 108 (90 Base) MCG/ACT inhaler Inhale 2 puffs into the lungs every 6 (six) hours as needed for wheezing or shortness of breath. 04/05/19   Terressa Koyanagi, DO    ROS: All other systems have been reviewed and were otherwise negative with the exception of those mentioned in the HPI and as above.  Physical Exam: General: Alert, no acute distress Cardiovascular: No pedal edema Respiratory: No cyanosis, no use of accessory musculature GI: No organomegaly, abdomen is soft and non-tender Skin: No lesions in the area of chief complaint Neurologic: Sensation intact distally Psychiatric: Patient is competent for consent with normal mood and affect Lymphatic: No axillary or cervical lymphadenopathy  MUSCULOSKELETAL:  LUE: Cuff strength and shoulder exam is without weakness and no  provocative findings.  She is tender to palpation about the clavicle itself.  Warm, well-perfused extremity.    Imaging: CT of left shoulder showing healed left clavicle fracture status post ORIF. No signs of hardware complications  Assessment: COMPLICATION FO ORTHOPEDIC DEVICES  Plan: Plan for Procedure(s): REMOVAL HARDWARE  Patient would like to have her plate removed, and we think that is reasonable.  We talked to her about the risks, benefits, and alternatives of this.  She understands and elected to proceed.  We will work on surgical scheduling.  She  understands she will have to have a six week period afterwards to have her hardware-based holes to seal up before going forward with more strenuous activities.    The risks benefits and alternatives were discussed with the patient including but not limited to the risks of nonoperative treatment, versus surgical intervention including infection, bleeding, nerve injury,  blood clots, cardiopulmonary complications, morbidity, mortality, among others, and they were willing to proceed.   The patient acknowledged the explanation, agreed to proceed with the plan and consent was signed.   Operative Plan: Left clavicle hardware removal Discharge Medications: Standard DVT Prophylaxis: None Physical Therapy: +/- Special Discharge needs: Sling for few days   Vernetta Honey, PA-C  12/08/2020 4:17 PM

## 2020-12-11 ENCOUNTER — Ambulatory Visit (HOSPITAL_BASED_OUTPATIENT_CLINIC_OR_DEPARTMENT_OTHER)
Admission: RE | Admit: 2020-12-11 | Discharge: 2020-12-11 | Disposition: A | Discharge status: Home / Self Care | Payer: BC Managed Care – PPO | Attending: Orthopaedic Surgery | Admitting: Orthopaedic Surgery

## 2020-12-11 ENCOUNTER — Encounter (HOSPITAL_BASED_OUTPATIENT_CLINIC_OR_DEPARTMENT_OTHER)
Admission: RE | Disposition: A | Discharge status: Home / Self Care | Payer: Self-pay | Source: Home / Self Care | Attending: Orthopaedic Surgery

## 2020-12-11 ENCOUNTER — Ambulatory Visit (HOSPITAL_BASED_OUTPATIENT_CLINIC_OR_DEPARTMENT_OTHER): Payer: BC Managed Care – PPO | Admitting: Certified Registered"

## 2020-12-11 ENCOUNTER — Encounter (HOSPITAL_BASED_OUTPATIENT_CLINIC_OR_DEPARTMENT_OTHER): Payer: Self-pay | Admitting: Orthopaedic Surgery

## 2020-12-11 ENCOUNTER — Other Ambulatory Visit: Payer: Self-pay

## 2020-12-11 DIAGNOSIS — Y831 Surgical operation with implant of artificial internal device as the cause of abnormal reaction of the patient, or of later complication, without mention of misadventure at the time of the procedure: Secondary | ICD-10-CM | POA: Diagnosis not present

## 2020-12-11 DIAGNOSIS — Z793 Long term (current) use of hormonal contraceptives: Secondary | ICD-10-CM | POA: Insufficient documentation

## 2020-12-11 DIAGNOSIS — Z20822 Contact with and (suspected) exposure to covid-19: Secondary | ICD-10-CM | POA: Insufficient documentation

## 2020-12-11 DIAGNOSIS — T84498A Other mechanical complication of other internal orthopedic devices, implants and grafts, initial encounter: Secondary | ICD-10-CM | POA: Diagnosis not present

## 2020-12-11 DIAGNOSIS — T8484XA Pain due to internal orthopedic prosthetic devices, implants and grafts, initial encounter: Secondary | ICD-10-CM | POA: Diagnosis not present

## 2020-12-11 DIAGNOSIS — F909 Attention-deficit hyperactivity disorder, unspecified type: Secondary | ICD-10-CM | POA: Diagnosis not present

## 2020-12-11 HISTORY — PX: HARDWARE REMOVAL: SHX979

## 2020-12-11 LAB — POCT PREGNANCY, URINE: Preg Test, Ur: NEGATIVE

## 2020-12-11 SURGERY — REMOVAL, HARDWARE
Anesthesia: General | Site: Shoulder | Laterality: Left

## 2020-12-11 MED ORDER — ONDANSETRON HCL 4 MG PO TABS: 4.0000 mg | ORAL_TABLET | Freq: Three times a day (TID) | ORAL | 0 refills | Status: AC | PRN

## 2020-12-11 MED ORDER — MIDAZOLAM HCL 5 MG/5ML IJ SOLN
INTRAMUSCULAR | Status: DC | PRN
  Administered 2020-12-11: 2 mg via INTRAVENOUS

## 2020-12-11 MED ORDER — CEFAZOLIN SODIUM-DEXTROSE 2-4 GM/100ML-% IV SOLN
INTRAVENOUS | Status: AC
  Filled 2020-12-11: qty 100

## 2020-12-11 MED ORDER — PROMETHAZINE HCL 25 MG/ML IJ SOLN: 6.2500 mg | INTRAMUSCULAR | Status: DC | PRN

## 2020-12-11 MED ORDER — MELOXICAM 7.5 MG PO TABS: 7.5000 mg | ORAL_TABLET | Freq: Every day | ORAL | 0 refills | Status: AC

## 2020-12-11 MED ORDER — EPHEDRINE 5 MG/ML INJ
INTRAVENOUS | Status: AC
  Filled 2020-12-11: qty 10

## 2020-12-11 MED ORDER — FENTANYL CITRATE (PF) 100 MCG/2ML IJ SOLN
INTRAMUSCULAR | Status: AC
  Filled 2020-12-11: qty 2

## 2020-12-11 MED ORDER — EPHEDRINE SULFATE 50 MG/ML IJ SOLN
INTRAMUSCULAR | Status: DC | PRN
  Administered 2020-12-11: 10 mg via INTRAVENOUS

## 2020-12-11 MED ORDER — OXYCODONE HCL 5 MG PO TABS
5.0000 mg | ORAL_TABLET | Freq: Once | ORAL | Status: DC | PRN
Start: 2020-12-11 — End: 2020-12-11

## 2020-12-11 MED ORDER — ONDANSETRON HCL 4 MG/2ML IJ SOLN
INTRAMUSCULAR | Status: AC
  Filled 2020-12-11: qty 2

## 2020-12-11 MED ORDER — FENTANYL CITRATE (PF) 100 MCG/2ML IJ SOLN
INTRAMUSCULAR | Status: DC | PRN
  Administered 2020-12-11 (×4): 25 ug via INTRAVENOUS

## 2020-12-11 MED ORDER — MEPERIDINE HCL 25 MG/ML IJ SOLN: 6.2500 mg | INTRAMUSCULAR | Status: DC | PRN

## 2020-12-11 MED ORDER — MIDAZOLAM HCL 2 MG/2ML IJ SOLN
INTRAMUSCULAR | Status: AC
  Filled 2020-12-11: qty 2

## 2020-12-11 MED ORDER — AMISULPRIDE (ANTIEMETIC) 5 MG/2ML IV SOLN: 10.0000 mg | Freq: Once | INTRAVENOUS | Status: DC | PRN

## 2020-12-11 MED ORDER — HYDROMORPHONE HCL 1 MG/ML IJ SOLN: 0.2500 mg | INTRAMUSCULAR | Status: DC | PRN

## 2020-12-11 MED ORDER — LACTATED RINGERS IV SOLN: INTRAVENOUS | Status: DC

## 2020-12-11 MED ORDER — PROPOFOL 10 MG/ML IV BOLUS
INTRAVENOUS | Status: DC | PRN
  Administered 2020-12-11: 200 mg via INTRAVENOUS

## 2020-12-11 MED ORDER — VANCOMYCIN HCL 1000 MG IV SOLR
INTRAVENOUS | Status: DC | PRN
  Administered 2020-12-11: 1000 mg via TOPICAL

## 2020-12-11 MED ORDER — LIDOCAINE HCL (CARDIAC) PF 100 MG/5ML IV SOSY
PREFILLED_SYRINGE | INTRAVENOUS | Status: DC | PRN
  Administered 2020-12-11: 60 mg via INTRAVENOUS

## 2020-12-11 MED ORDER — ONDANSETRON HCL 4 MG/2ML IJ SOLN
INTRAMUSCULAR | Status: DC | PRN
  Administered 2020-12-11: 4 mg via INTRAVENOUS

## 2020-12-11 MED ORDER — TRAMADOL HCL 50 MG PO TABS: 50.0000 mg | ORAL_TABLET | Freq: Four times a day (QID) | ORAL | 0 refills | Status: AC | PRN

## 2020-12-11 MED ORDER — VANCOMYCIN HCL 1000 MG IV SOLR
INTRAVENOUS | Status: AC
  Filled 2020-12-11: qty 1000

## 2020-12-11 MED ORDER — BUPIVACAINE HCL (PF) 0.25 % IJ SOLN
INTRAMUSCULAR | Status: DC | PRN
  Administered 2020-12-11: 30 mL

## 2020-12-11 MED ORDER — OXYCODONE HCL 5 MG/5ML PO SOLN: 5.0000 mg | Freq: Once | ORAL | Status: DC | PRN

## 2020-12-11 MED ORDER — BUPIVACAINE-EPINEPHRINE 0.25% -1:200000 IJ SOLN: INTRAMUSCULAR | Status: DC | PRN

## 2020-12-11 MED ORDER — LIDOCAINE 2% (20 MG/ML) 5 ML SYRINGE
INTRAMUSCULAR | Status: AC
  Filled 2020-12-11: qty 5

## 2020-12-11 MED ORDER — ACETAMINOPHEN 500 MG PO TABS: 1000.0000 mg | ORAL_TABLET | Freq: Three times a day (TID) | ORAL | 0 refills | Status: AC

## 2020-12-11 MED ORDER — CEFAZOLIN SODIUM-DEXTROSE 2-4 GM/100ML-% IV SOLN
2.0000 g | INTRAVENOUS | Status: AC
  Administered 2020-12-11: 2 g via INTRAVENOUS

## 2020-12-11 MED ORDER — PROPOFOL 10 MG/ML IV BOLUS
INTRAVENOUS | Status: AC
  Filled 2020-12-11: qty 40

## 2020-12-11 MED ORDER — DEXAMETHASONE SODIUM PHOSPHATE 10 MG/ML IJ SOLN
INTRAMUSCULAR | Status: AC
  Filled 2020-12-11: qty 2

## 2020-12-11 MED ORDER — DEXAMETHASONE SODIUM PHOSPHATE 10 MG/ML IJ SOLN
INTRAMUSCULAR | Status: DC | PRN
  Administered 2020-12-11: 10 mg via INTRAVENOUS

## 2020-12-11 MED ORDER — BUPIVACAINE HCL (PF) 0.25 % IJ SOLN
INTRAMUSCULAR | Status: AC
  Filled 2020-12-11: qty 30

## 2020-12-11 SURGICAL SUPPLY — 54 items
AID PSTN UNV HD RSTRNT DISP (MISCELLANEOUS) ×2
APL PRP STRL LF DISP 70% ISPRP (MISCELLANEOUS) ×2
BLADE HEX COATED 2.75 (ELECTRODE) IMPLANT
BLADE SURG 10 STRL SS (BLADE) ×3 IMPLANT
BLADE SURG 15 STRL LF DISP TIS (BLADE) ×2 IMPLANT
BLADE SURG 15 STRL SS (BLADE) ×3
BNDG COHESIVE 4X5 TAN STRL (GAUZE/BANDAGES/DRESSINGS) IMPLANT
CHLORAPREP W/TINT 26 (MISCELLANEOUS) ×3 IMPLANT
CLSR STERI-STRIP ANTIMIC 1/2X4 (GAUZE/BANDAGES/DRESSINGS) ×3 IMPLANT
COOLER ICEMAN CLASSIC (MISCELLANEOUS) IMPLANT
COVER WAND RF STERILE (DRAPES) IMPLANT
DECANTER SPIKE VIAL GLASS SM (MISCELLANEOUS) ×3 IMPLANT
DRAPE C-ARM 42X72 X-RAY (DRAPES) IMPLANT
DRAPE IMP U-DRAPE 54X76 (DRAPES) ×3 IMPLANT
DRAPE INCISE IOBAN 66X45 STRL (DRAPES) IMPLANT
DRAPE U-SHAPE 76X120 STRL (DRAPES) ×6 IMPLANT
DRSG AQUACEL AG ADV 3.5X 6 (GAUZE/BANDAGES/DRESSINGS) ×3 IMPLANT
ELECT REM PT RETURN 9FT ADLT (ELECTROSURGICAL) ×3
ELECTRODE REM PT RTRN 9FT ADLT (ELECTROSURGICAL) ×2 IMPLANT
GLOVE SRG 8 PF TXTR STRL LF DI (GLOVE) ×2 IMPLANT
GLOVE SURG ENC MOIS LTX SZ6.5 (GLOVE) ×6 IMPLANT
GLOVE SURG ENC MOIS LTX SZ8 (GLOVE) ×3 IMPLANT
GLOVE SURG LTX SZ8 (GLOVE) IMPLANT
GLOVE SURG UNDER POLY LF SZ6.5 (GLOVE) ×3 IMPLANT
GLOVE SURG UNDER POLY LF SZ7 (GLOVE) ×3 IMPLANT
GLOVE SURG UNDER POLY LF SZ8 (GLOVE) ×3
GOWN STRL REUS W/ TWL LRG LVL3 (GOWN DISPOSABLE) ×4 IMPLANT
GOWN STRL REUS W/TWL LRG LVL3 (GOWN DISPOSABLE) ×6
GOWN STRL REUS W/TWL XL LVL3 (GOWN DISPOSABLE) ×3 IMPLANT
KIT STABILIZATION SHOULDER (MISCELLANEOUS) IMPLANT
PACK ARTHROSCOPY DSU (CUSTOM PROCEDURE TRAY) ×3 IMPLANT
PACK BASIN DAY SURGERY FS (CUSTOM PROCEDURE TRAY) ×3 IMPLANT
PAD COLD SHLDR WRAP-ON (PAD) IMPLANT
PENCIL SMOKE EVACUATOR (MISCELLANEOUS) ×3 IMPLANT
RESTRAINT HEAD UNIVERSAL NS (MISCELLANEOUS) ×3 IMPLANT
SHEET MEDIUM DRAPE 40X70 STRL (DRAPES) ×3 IMPLANT
SLEEVE SCD COMPRESS KNEE MED (STOCKING) ×3 IMPLANT
SLING ARM FOAM STRAP LRG (SOFTGOODS) ×3 IMPLANT
SPONGE LAP 18X18 RF (DISPOSABLE) IMPLANT
SPONGE LAP 4X18 RFD (DISPOSABLE) ×3 IMPLANT
SUCTION FRAZIER HANDLE 10FR (MISCELLANEOUS)
SUCTION TUBE FRAZIER 10FR DISP (MISCELLANEOUS) IMPLANT
SUT MNCRL AB 4-0 PS2 18 (SUTURE) ×3 IMPLANT
SUT VIC AB 0 CT1 18XCR BRD 8 (SUTURE) IMPLANT
SUT VIC AB 0 CT1 27 (SUTURE) ×3
SUT VIC AB 0 CT1 27XCR 8 STRN (SUTURE) ×2 IMPLANT
SUT VIC AB 0 CT1 8-18 (SUTURE)
SUT VIC AB 0 SH 27 (SUTURE) IMPLANT
SUT VIC AB 3-0 SH 27 (SUTURE) ×3
SUT VIC AB 3-0 SH 27X BRD (SUTURE) ×2 IMPLANT
SYR BULB EAR ULCER 3OZ GRN STR (SYRINGE) ×3 IMPLANT
TOWEL GREEN STERILE FF (TOWEL DISPOSABLE) ×6 IMPLANT
TUBE SUCTION HIGH CAP CLEAR NV (SUCTIONS) ×3 IMPLANT
YANKAUER SUCT BULB TIP NO VENT (SUCTIONS) IMPLANT

## 2020-12-11 NOTE — Discharge Instructions (Signed)
Ramond Marrow MD, MPH Alfonse Alpers, PA-C [facility] [address] [phone] (tel)   [phone] (fax)   POST-OPERATIVE INSTRUCTIONS - CLAVICLE   WOUND CARE - Please keep dressing intact until followup.  - You may shower on Post-Op Day #2.  - The dressing is water resistant but do not scrub it as it may start to peel up.  - Gently pat the area dry.  - Do not soak the shoulder in water. Do not go swimming in the pool or ocean until about 4 weeks after surgery - KEEP THE INCISIONS CLEAN AND DRY.  EXERCISES - You may use your sling for comfort for the first week after surgery - Please continue to work on range of motion of your fingers and wrist and stretch these multiple times a day to prevent stiffness.  REGIONAL ANESTHESIA (NERVE BLOCKS) . The anesthesia team may have performed a nerve block for you if safe in the setting of your care.  This is a great tool used to minimize pain.  Typically the block may start wearing off overnight but the long acting medicine may last for 3-4 days.  The nerve block wearing off can be a challenging period but please utilize your as needed pain medications to try and manage this period.    POST-OP MEDICATIONS- Multimodal approach to pain control . In general your pain will be controlled with a combination of substances.  Prescriptions unless otherwise discussed are electronically sent to your pharmacy.  This is a carefully made plan we use to minimize narcotic use.     ? Meloxicam - Anti-inflammatory medication taken on a scheduled basis ? Acetaminophen - Non-narcotic pain medicine taken on a scheduled basis  ? Tramadol - This is a strong narcotic, to be used only on an "as needed" basis for pain. ? Zofran - take as needed for nausea   FOLLOW-UP - If you develop a Fever (>101.5), Redness or Drainage from the surgical incision site, please call our office to arrange for an evaluation. - Please call the office to  schedule a follow-up appointment for your incision check if you do not already have one, 7-10 days post-operatively.  IF YOU HAVE ANY QUESTIONS, PLEASE FEEL FREE TO CALL OUR OFFICE.  HELPFUL INFORMATION  If you had a block, it will wear off between 8-24 hrs postop typically.  This is period when your pain may go from nearly zero to the pain you would have had post-op without the block.  This is an abrupt transition but nothing dangerous is happening.  You may take an extra dose of narcotic when this happens.  You may be more comfortable sleeping in a semi-seated position the first few nights following surgery.  Keep a pillow propped under the elbow and forearm for comfort.  If you have a recliner type of chair it might be beneficial.    When dressing, put your operative arm in the sleeve first.  When getting undressed, take your operative arm out last.  Loose fitting, button-down shirts are recommended.  Often in the first days after surgery you may be more comfortable keeping your operative arm under your shirt and not through the sleeve.  You may return to work/school in the next couple of days when you feel up to it.   We suggest you use the pain medication the first night prior to going to bed, in order to ease any pain when the anesthesia wears off. You should avoid taking  pain medications on an empty stomach as it will make you nauseous.  You should wean off your narcotic medicines as soon as you are able.  Most patients will be off or using minimal narcotics before their first postop appointment.   Do not drink alcoholic beverages or take illicit drugs when taking pain medications.  In most states it is against the law to drive while your arm is in a sling. And certainly against the law to drive while taking narcotics.  Pain medication may make you constipated.  Below are a few solutions to try in this order: - Decrease the amount of pain medication if you aren't having pain. - Drink  lots of decaffeinated fluids. - Drink prune juice and/or each dried prunes  If the first 3 don't work start with additional solutions - Take Colace - an over-the-counter stool softener - Take Senokot - an over-the-counter laxative - Take Miralax - a stronger over-the-counter laxative  For more information including helpful videos and documents visit our website:   [email or web address]     Post Anesthesia Home Care Instructions  Activity: Get plenty of rest for the remainder of the day. A responsible individual must stay with you for 24 hours following the procedure.  For the next 24 hours, DO NOT: -Drive a car -Advertising copywriter -Drink alcoholic beverages -Take any medication unless instructed by your physician -Make any legal decisions or sign important papers.  Meals: Start with liquid foods such as gelatin or soup. Progress to regular foods as tolerated. Avoid greasy, spicy, heavy foods. If nausea and/or vomiting occur, drink only clear liquids until the nausea and/or vomiting subsides. Call your physician if vomiting continues.  Special Instructions/Symptoms: Your throat may feel dry or sore from the anesthesia or the breathing tube placed in your throat during surgery. If this causes discomfort, gargle with warm salt water. The discomfort should disappear within 24 hours.  If you had a scopolamine patch placed behind your ear for the management of post- operative nausea and/or vomiting:  1. The medication in the patch is effective for 72 hours, after which it should be removed.  Wrap patch in a tissue and discard in the trash. Wash hands thoroughly with soap and water. 2. You may remove the patch earlier than 72 hours if you experience unpleasant side effects which may include dry mouth, dizziness or visual disturbances. 3. Avoid touching the patch. Wash your hands with soap and water after contact with the patch.

## 2020-12-11 NOTE — Anesthesia Postprocedure Evaluation (Signed)
Anesthesia Post Note  Patient: Bailey Reyes  Procedure(s) Performed: REMOVAL HARDWARE FROM LEFT CLAVICLE (Left Shoulder)     Patient location during evaluation: PACU Anesthesia Type: General Level of consciousness: awake and alert Pain management: pain level controlled Vital Signs Assessment: post-procedure vital signs reviewed and stable Respiratory status: spontaneous breathing, nonlabored ventilation and respiratory function stable Cardiovascular status: blood pressure returned to baseline and stable Postop Assessment: no apparent nausea or vomiting Anesthetic complications: no   No complications documented.  Last Vitals:  Vitals:   12/11/20 1130 12/11/20 1210  BP: (!) 108/59 114/78  Pulse: 81 77  Resp: 15 17  Temp:  36.6 C  SpO2: 99% 100%    Last Pain:  Vitals:   12/11/20 1156  TempSrc:   PainSc: 0-No pain                 Lowella Curb

## 2020-12-11 NOTE — Op Note (Signed)
Orthopaedic Surgery Operative Note (CSN: [id number])  Bailey Reyes  02/04/1991 Date of Surgery: 12/11/2020   Diagnoses:  Painful hardware left clavicle  Procedure: Removal of hardware   Operative Finding Successful completion of the planned procedure.  No issues removing hardware, 7 screws and 1 plate were removed.  Post-operative plan: The patient will be less than 15 pound weight limit with no impact activities for 6 weeks.  The patient will be discharged home.  DVT prophylaxis not indicated in this ambulatory upper extremity patient without significant risk factors.   Pain control with PRN pain medication preferring oral medicines.  Follow up plan will be scheduled in approximately 7 days for incision check and XR.  Post-Op Diagnosis: Same Surgeons:Primary: Bjorn Pippin, MD Assistants:Caroline McBane PA-C Location: [facility] OR ROOM 6 Anesthesia: General with local anesthesia Antibiotics: Ancef 2 g with local vancomycin powder 1 g at the surgical site Tourniquet time: * No tourniquets in log * Estimated Blood Loss: Minimal Complications: None Specimens: None Implants: * No implants in log *  Indications for Surgery:   Bailey Reyes is a 30 y.o. female with previous history of clavicle fracture fixated with a plate and screw construct.  She had symptomatic hardware afterwards despite appropriate time and nonoperative measures she continued of symptoms.  Benefits and risks of operative and nonoperative management were discussed prior to surgery with patient/guardian(s) and informed consent form was completed.  Specific risks including infection, need for additional surgery, periprosthetic fracture, incisional concerns, periincisional numbness.   Procedure:   The patient was identified properly. Informed consent was obtained and the surgical site was marked. The patient was taken up to suite where general anesthesia was induced.  The patient was positioned beachchair on [employer]  table.  The left shoulder was prepped and draped in the usual sterile fashion.  Timeout was performed before the beginning of the case.  We utilized the patient's previous incision with the skin sharp achieving hemostasis we progressed.  We identified the split the fascia superiorly out of plane with our skin incision and opened that sharply.  We then subperiosteally elevated to expose the plate.  Sick screws removed and the plate was removed.  Anteriorly a lag screw was placed and was removed.  All prominent areas of the previous clavicle plate were debrided with a rondure.  We irrigated the wound copiously before placing local antibiotic as listed above.  We closed the incision in a multilayer fashion with absorbable suture.  Sterile dressing was placed.  Patient was awoken taken to PACU in stable condition.  Alfonse Alpers, PA-C, present and scrubbed throughout the case, critical for completion in a timely fashion, and for retraction, instrumentation, closure.

## 2020-12-11 NOTE — Anesthesia Procedure Notes (Signed)
Procedure Name: LMA Insertion Date/Time: 12/11/2020 10:17 AM Performed by: Lauralyn Primes, CRNA Pre-anesthesia Checklist: Patient identified, Emergency Drugs available, Suction available and Patient being monitored Patient Re-evaluated:Patient Re-evaluated prior to induction Oxygen Delivery Method: Circle system utilized Preoxygenation: Pre-oxygenation with 100% oxygen Induction Type: IV induction Ventilation: Mask ventilation without difficulty LMA: LMA inserted LMA Size: 4.0 Number of attempts: 1 Airway Equipment and Method: Bite block Placement Confirmation: positive ETCO2 Tube secured with: Tape Dental Injury: Teeth and Oropharynx as per pre-operative assessment

## 2020-12-11 NOTE — Interval H&P Note (Signed)
History and Physical Interval Note:  12/11/2020 9:45 AM  Bailey Reyes  has presented today for surgery, with the diagnosis of COMPLICATION FO ORTHOPEDIC DEVICES.  The various methods of treatment have been discussed with the patient and family. After consideration of risks, benefits and other options for treatment, the patient has consented to  Procedure(s): REMOVAL HARDWARE (Left) as a surgical intervention.  The patient's history has been reviewed, patient examined, no change in status, stable for surgery.  I have reviewed the patient's chart and labs.  Questions were answered to the patient's satisfaction.     Bjorn Pippin

## 2020-12-11 NOTE — Transfer of Care (Signed)
Immediate Anesthesia Transfer of Care Note  Patient: Bailey Reyes  Procedure(s) Performed: REMOVAL HARDWARE FROM LEFT CLAVICLE (Left Shoulder)  Patient Location: PACU  Anesthesia Type:General  Level of Consciousness: awake, alert  and oriented  Airway & Oxygen Therapy: Patient Spontanous Breathing and Patient connected to face mask oxygen  Post-op Assessment: Report given to RN and Post -op Vital signs reviewed and stable  Post vital signs: Reviewed and stable  Last Vitals:  Vitals Value Taken Time  BP 124/72 12/11/20 1103  Temp    Pulse 89 12/11/20 1104  Resp 13 12/11/20 1104  SpO2 100 % 12/11/20 1104  Vitals shown include unvalidated device data.  Last Pain:  Vitals:   12/11/20 0915  TempSrc: Oral  PainSc: 0-No pain         Complications: No complications documented.

## 2020-12-11 NOTE — Anesthesia Preprocedure Evaluation (Signed)
Anesthesia Evaluation  Patient identified by MRN, date of birth, ID band Patient awake    Reviewed: Allergy & Precautions, NPO status , Patient's Chart, lab work & pertinent test results  Airway Mallampati: II  TM Distance: >3 FB Neck ROM: Full    Dental no notable dental hx.    Pulmonary asthma ,    Pulmonary exam normal breath sounds clear to auscultation       Cardiovascular negative cardio ROS Normal cardiovascular exam Rhythm:Regular Rate:Normal     Neuro/Psych negative neurological ROS  negative psych ROS   GI/Hepatic negative GI ROS, Neg liver ROS,   Endo/Other  negative endocrine ROS  Renal/GU negative Renal ROS  negative genitourinary   Musculoskeletal negative musculoskeletal ROS (+)   Abdominal   Peds negative pediatric ROS (+)  Hematology negative hematology ROS (+)   Anesthesia Other Findings   Reproductive/Obstetrics negative OB ROS                             Anesthesia Physical Anesthesia Plan  ASA: II  Anesthesia Plan: General   Post-op Pain Management:    Induction: Intravenous  PONV Risk Score and Plan: 3 and Ondansetron, Dexamethasone, Midazolam and Treatment may vary due to age or medical condition  Airway Management Planned: LMA  Additional Equipment:   Intra-op Plan:   Post-operative Plan: Extubation in OR  Informed Consent: I have reviewed the patients History and Physical, chart, labs and discussed the procedure including the risks, benefits and alternatives for the proposed anesthesia with the patient or authorized representative who has indicated his/her understanding and acceptance.     Dental advisory given  Plan Discussed with: CRNA  Anesthesia Plan Comments:         Anesthesia Quick Evaluation  

## 2020-12-15 ENCOUNTER — Encounter (HOSPITAL_BASED_OUTPATIENT_CLINIC_OR_DEPARTMENT_OTHER): Payer: Self-pay | Admitting: Orthopaedic Surgery

## 2020-12-16 DIAGNOSIS — M25512 Pain in left shoulder: Secondary | ICD-10-CM | POA: Diagnosis not present

## 2021-03-23 DIAGNOSIS — Z130 Encounter for screening for diseases of the blood and blood-forming organs and certain disorders involving the immune mechanism: Secondary | ICD-10-CM | POA: Diagnosis not present

## 2021-03-23 DIAGNOSIS — Z6828 Body mass index (BMI) 28.0-28.9, adult: Secondary | ICD-10-CM | POA: Diagnosis not present

## 2021-03-23 DIAGNOSIS — Z01419 Encounter for gynecological examination (general) (routine) without abnormal findings: Secondary | ICD-10-CM | POA: Diagnosis not present

## 2021-03-23 DIAGNOSIS — Z319 Encounter for procreative management, unspecified: Secondary | ICD-10-CM | POA: Diagnosis not present

## 2021-03-23 DIAGNOSIS — R319 Hematuria, unspecified: Secondary | ICD-10-CM | POA: Diagnosis not present

## 2021-05-19 ENCOUNTER — Ambulatory Visit: Payer: BC Managed Care – PPO | Admitting: Psychologist

## 2021-05-28 ENCOUNTER — Ambulatory Visit (INDEPENDENT_AMBULATORY_CARE_PROVIDER_SITE_OTHER): Payer: BC Managed Care – PPO | Admitting: Psychology

## 2021-05-28 DIAGNOSIS — F411 Generalized anxiety disorder: Secondary | ICD-10-CM | POA: Diagnosis not present

## 2021-06-03 ENCOUNTER — Ambulatory Visit (INDEPENDENT_AMBULATORY_CARE_PROVIDER_SITE_OTHER): Payer: BC Managed Care – PPO | Admitting: Psychology

## 2021-06-03 DIAGNOSIS — F411 Generalized anxiety disorder: Secondary | ICD-10-CM

## 2021-06-26 ENCOUNTER — Ambulatory Visit (INDEPENDENT_AMBULATORY_CARE_PROVIDER_SITE_OTHER): Payer: BC Managed Care – PPO | Admitting: Psychology

## 2021-06-26 DIAGNOSIS — F411 Generalized anxiety disorder: Secondary | ICD-10-CM | POA: Diagnosis not present

## 2021-07-15 ENCOUNTER — Ambulatory Visit (INDEPENDENT_AMBULATORY_CARE_PROVIDER_SITE_OTHER): Payer: BC Managed Care – PPO | Admitting: Psychology

## 2021-07-15 DIAGNOSIS — F411 Generalized anxiety disorder: Secondary | ICD-10-CM | POA: Diagnosis not present

## 2021-07-27 ENCOUNTER — Telehealth: Payer: Self-pay | Admitting: Internal Medicine

## 2021-07-27 NOTE — Telephone Encounter (Signed)
Patient would like a call back to find out who Dr. Fabian Sharp recommends she call for a dermatologist.

## 2021-07-28 NOTE — Telephone Encounter (Signed)
[facility] dermatology  [facility] dermatology Dermatology specialists  Are all fine

## 2021-07-28 NOTE — Telephone Encounter (Signed)
Pt informed of the message and verbalized understanding  

## 2021-07-30 ENCOUNTER — Ambulatory Visit (INDEPENDENT_AMBULATORY_CARE_PROVIDER_SITE_OTHER): Payer: BC Managed Care – PPO

## 2021-07-30 DIAGNOSIS — Z23 Encounter for immunization: Secondary | ICD-10-CM | POA: Diagnosis not present

## 2021-08-05 ENCOUNTER — Ambulatory Visit (INDEPENDENT_AMBULATORY_CARE_PROVIDER_SITE_OTHER): Payer: BC Managed Care – PPO | Admitting: Psychology

## 2021-08-05 DIAGNOSIS — F411 Generalized anxiety disorder: Secondary | ICD-10-CM

## 2021-08-05 NOTE — Progress Notes (Signed)
[facility] Counselor/Therapist Progress Note  Patient ID: AKIAH BAUCH, MRN: [id number],    Date: 08/05/2021  Time Spent: 50 mins  Treatment Type: Individual Therapy  Reported Symptoms: Pt shares that her grandmother and grandfather (who raised pt) were with them at [facility] parents' home for Thanksgiving.  This was uncomfortable for pt given her grandmother's routine behavior of making fun of pt in front of others.   Mental Status Exam: Appearance:  Casual     Behavior: Appropriate  Motor: Normal  Speech/Language:  Clear and Coherent  Affect: Appropriate  Mood: normal  Thought process: normal  Thought content:   WNL  Sensory/Perceptual disturbances:   WNL  Orientation: oriented to person, place, and time/date  Attention: Good  Concentration: Good  Memory: WNL  Fund of knowledge:  Good  Insight:   Good  Judgment:  Good  Impulse Control: Good   Risk Assessment: Danger to Self:  No Self-injurious Behavior: No Danger to Others: No Duty to Warn:no Physical Aggression / Violence:No  Access to Firearms a concern: No  Gang Involvement:No   Subjective: Pt presents via Webex video for today's session.  She grants consent for the session, stating she in in her home with no one else present.  I shared with pt that I am at home in my office with no one else present here.  Pt shares they went to her in-laws for Thanksgiving with her grandparents; Tinnie Gens cooks the [place] every year on their grill.  Pt experiences stress when her grandmother is present for holidays, but everything was fine this year.  Her grandmother will likely deliver "digs" at pt when in environments like that.  Pt shares that her grandmother "always has to be right."  Pt shares that Tinnie Gens has had a really good year at work this year and pt likes to celebrate his successes.  Pt shares that she is generally very good about allowing herself to enjoy what she has/does and is pretty successful in not  buying into her grandmother's perspective.  "When I think about it too much, it is hard for me to deal with.  I know my grandparents love me, but sometimes it makes me sad that my childhood was the way it was."  Pt shares that Tinnie Gens and his family have always talked things out and pt has "gotten very good about talking about my feelings.  I just can't talk to my grandparents about my feelings."  Pt shares that hers and Jeffrey's 4 yr anniversary is tomorrow; they are going to [facility] for a long weekend trip to celebrate.   Talked with pt about the possible need to talk with her grandmother in the future about the need to treat pt better.  We will continue to talk about this topic as we move forward.  Pt shares she has been walking more frequently, has been journaling and has been working out more frequently as well.  Encouraged pt to continue with her self care activities and we will meet in 2 wks for a follow up session.  Interventions: Cognitive Behavioral Therapy  Diagnosis:Generalized anxiety disorder  Plan: Pt is to return for follow up session in 2 wks.  Karie Kirks, [facility]

## 2021-08-19 ENCOUNTER — Ambulatory Visit (INDEPENDENT_AMBULATORY_CARE_PROVIDER_SITE_OTHER): Payer: BC Managed Care – PPO | Admitting: Psychology

## 2021-08-19 DIAGNOSIS — F411 Generalized anxiety disorder: Secondary | ICD-10-CM | POA: Diagnosis not present

## 2021-08-19 NOTE — Progress Notes (Signed)
[facility] Counselor/Therapist Progress Note  Patient ID: Bailey Reyes, MRN: [id number],    Date: 08/19/2021  Time Spent: 50 mins  Treatment Type: Individual Therapy  Reported Symptoms: Pt presented for the follow up session, via webex video, due to virus outbreak.  Pt granted consent for session, stating that she is in her home with no one else present.  I shared with pt that I am in my office with no one else present here.  Pt shares she is getting ready for Christmas and has "been great since our last session.  I learned on 12/13 that I am pregnant.  I am very excited and also very anxious.  I have been so focused on not having a baby and now that is all gone.  I was afraid it was going to be hard to get pregnant."  Mental Status Exam: Appearance:  Casual     Behavior: Appropriate  Motor: Normal  Speech/Language:  Clear and Coherent  Affect: Appropriate  Mood: normal  Thought process: normal  Thought content:   WNL  Sensory/Perceptual disturbances:   WNL  Orientation: oriented to person, place, and time/date  Attention: Good  Concentration: Good  Memory: WNL  Fund of knowledge:  Good  Insight:   Good  Judgment:  Good  Impulse Control: Good   Risk Assessment: Danger to Self:  No Self-injurious Behavior: No Danger to Others: No Duty to Warn:no Physical Aggression / Violence:No  Access to Firearms a concern: No  Gang Involvement:No   Subjective: Pt is anxious "because I don't want anything to happen to the baby.  Some days I am fine and I seem to be getting less anxious each day."  Pt is very excited about her pregnancy and Tinnie Gens is very happy as well.  She is trying not to tell too many people right now because she is only [redacted] wks pregnant.  She shares she is exhausted and that is her only symptom at this time.  Pt has not been drinking alcohol and has been drinking lots of water.  Pt shares that she has been eating healthy since she found out about her  pregnancy; she is allowing herself the opportunity to take naps daily.  She will also enjoys wrapping presents and has plenty to wrap before Christmas.  Pt shares that she and Tinnie Gens will likely choose to do genetic testing to find out the gender of the child.  Encouraged pt to continue with her self care activities and we will meet in 2 wks for a follow up session.    Interventions: Cognitive Behavioral Therapy  Diagnosis:Generalized anxiety disorder  Plan: Treatment Plan Strengths/Abilities:  Intelligent, Intuitive, Willing to participate in therapy Treatment Preferences:  Outpatient Individual Therapy Statement of Needs:  Patient is to use CBT, mindfulness and coping skills to help manage and/or decrease symptoms associated with their diagnosis. Symptoms:  Depressed/Irritable mood, worry, social withdrawal Problems Addressed:  Depressive thoughts, Sadness, Sleep issues, etc. Long Term Goals:  Pt to reduce overall level, frequency, and intensity of the feelings of anxiety as evidenced by decreased irritability, negative self talk, and helpless feelings from 6 to 7 days/week to 0 to 1 days/week, per client report, for at least 3 consecutive months.  Progress: 25% Short Term Goals:  Pt to verbally express understanding of the relationship between feelings of anxiety and their impact on thinking patterns and behaviors.  Pt to verbalize an understanding of the role that distorted thinking plays in creating fears, excessive worry,  and ruminations.  Progress: 25% Target Date:  08/14/2022 Frequency:  Bi-weekly Modality:  Cognitive Behavioral Therapy Interventions by Therapist:  Therapist will use CBT, Mindfulness exercises, Coping skills and Referrals, as needed by client. Client has verbally approved this treatment plan.  Karie Kirks, [facility]

## 2021-08-27 DIAGNOSIS — Z3201 Encounter for pregnancy test, result positive: Secondary | ICD-10-CM | POA: Diagnosis not present

## 2021-08-27 DIAGNOSIS — N911 Secondary amenorrhea: Secondary | ICD-10-CM | POA: Diagnosis not present

## 2021-08-30 NOTE — L&D Delivery Note (Signed)
Delivery Note   Patient pushed well for about an hour and at 7:00 PM a healthy female was delivered via Vaginal, Spontaneous (Presentation: Left Occiput Posterior).  APGAR: 9, 9; weight  .   Placenta status: Spontaneous, Intact.  Cord: 3 vessels with the following complications: Short.   Anesthesia: Epidural Episiotomy: None Lacerations: 2nd degree Suture Repair: 2.0 vicryl Est. Blood Loss (mL): 121  Mom to postpartum.  Baby to Couplet care / Skin to Skin.  Oliver Pila 04/19/2022, 7:26 PM

## 2021-09-09 ENCOUNTER — Ambulatory Visit (INDEPENDENT_AMBULATORY_CARE_PROVIDER_SITE_OTHER): Payer: BC Managed Care – PPO | Admitting: Psychology

## 2021-09-09 DIAGNOSIS — F411 Generalized anxiety disorder: Secondary | ICD-10-CM | POA: Diagnosis not present

## 2021-09-09 NOTE — Progress Notes (Signed)
[facility] Counselor/Therapist Progress Note  Patient ID: Bailey Reyes, MRN: [id number],    Date: 09/09/2021  Time Spent: 50 mins  Treatment Type: Individual Therapy  Reported Symptoms: Pt presented for the follow up session, via webex video, due to virus outbreak.  Pt granted consent for session, stating that she is in her home with no one else present.  I shared with pt that I am in my office with no one else present here.  Pt shares she is doing really well with the pregnancy; "I am just really exhausted so far."    Mental Status Exam: Appearance:  Casual     Behavior: Appropriate  Motor: Normal  Speech/Language:  Clear and Coherent  Affect: Appropriate  Mood: normal  Thought process: normal  Thought content:   WNL  Sensory/Perceptual disturbances:   WNL  Orientation: oriented to person, place, and time/date  Attention: Good  Concentration: Good  Memory: WNL  Fund of knowledge:  Good  Insight:   Good  Judgment:  Good  Impulse Control: Good   Risk Assessment: Danger to Self:  No Self-injurious Behavior: No Danger to Others: No Duty to Warn:no Physical Aggression / Violence:No  Access to Firearms a concern: No  Gang Involvement:No   Subjective: Pt is really tired and knows that she will get her energy back near the 2nd trimester.  "I went for a walk last week and enjoyed that; I took the dog and she did well."  Pt shares that she is drinking plenty of water and continues to walk the dog.  Pt shares that Christmas was good and she enjoyed telling everyone that they are pregnant.  "My grandparents lost their minds; my grandpa cried and that was surprising to me; my grandmother was also happy for [place].  Pt appreciates her grandparents' responses to the news.  Bailey Reyes's mom cried too but then everyone moved on pretty quickly and that surprised me; I thought it would have been a bigger deal for them."  Pt shares that her grandmother has been really nice to her  and has been calling her almost everyday.  "Bailey Reyes's family has not called [place] to check in on [place] at all."  This is the first grandchild for either side of the family.  Pt has her first appt with her OB next week and will have a sonogram done next Wed.  Pt shares they are planning to have full genetic testing done during the blood work for that visit, due to the the issues in her side of the family.  They also want to know the gender of the child.  Her due date right now is 8/22.  Pt shares that she has been anxious about her pregnancy and is doing a very good job of managing that anxiety.  She has slept very well for the past week or so.  Pt shares that she has always believed that Bailey Reyes was a gift from God for her and that this baby is also a gift from God and these beliefs provide pt with a great sense of security and help her to fight against her anxious thoughts when they come up.  Encouraged pt to continue with her self care activities and we will meet in 2 wks for a follow up session.     Interventions: Cognitive Behavioral Therapy  Diagnosis:Generalized anxiety disorder  Plan: Treatment Plan Strengths/Abilities:  Intelligent, Intuitive, Willing to participate in therapy Treatment Preferences:  Outpatient Individual Therapy Statement of Needs:  Patient  is to use CBT, mindfulness and coping skills to help manage and/or decrease symptoms associated with their diagnosis. Symptoms:  Depressed/Irritable mood, worry, social withdrawal Problems Addressed:  Depressive thoughts, Sadness, Sleep issues, etc. Long Term Goals:  Pt to reduce overall level, frequency, and intensity of the feelings of anxiety as evidenced by decreased irritability, negative self talk, and helpless feelings from 6 to 7 days/week to 0 to 1 days/week, per client report, for at least 3 consecutive months.  Progress: 25% Short Term Goals:  Pt to verbally express understanding of the relationship between feelings of anxiety and their  impact on thinking patterns and behaviors.  Pt to verbalize an understanding of the role that distorted thinking plays in creating fears, excessive worry, and ruminations.  Progress: 25% Target Date:  08/14/2022 Frequency:  Bi-weekly Modality:  Cognitive Behavioral Therapy Interventions by Therapist:  Therapist will use CBT, Mindfulness exercises, Coping skills and Referrals, as needed by client. Client has verbally approved this treatment plan.  Bailey Reyes, [facility]

## 2021-09-16 DIAGNOSIS — Z113 Encounter for screening for infections with a predominantly sexual mode of transmission: Secondary | ICD-10-CM | POA: Diagnosis not present

## 2021-09-16 DIAGNOSIS — Z3A09 9 weeks gestation of pregnancy: Secondary | ICD-10-CM | POA: Diagnosis not present

## 2021-09-16 DIAGNOSIS — Z3689 Encounter for other specified antenatal screening: Secondary | ICD-10-CM | POA: Diagnosis not present

## 2021-09-16 DIAGNOSIS — O26891 Other specified pregnancy related conditions, first trimester: Secondary | ICD-10-CM | POA: Diagnosis not present

## 2021-09-16 DIAGNOSIS — Z363 Encounter for antenatal screening for malformations: Secondary | ICD-10-CM | POA: Diagnosis not present

## 2021-09-16 DIAGNOSIS — Z368A Encounter for antenatal screening for other genetic defects: Secondary | ICD-10-CM | POA: Diagnosis not present

## 2021-09-16 LAB — OB RESULTS CONSOLE HIV ANTIBODY (ROUTINE TESTING): HIV: NONREACTIVE

## 2021-09-16 LAB — OB RESULTS CONSOLE RPR: RPR: NONREACTIVE

## 2021-09-16 LAB — OB RESULTS CONSOLE GC/CHLAMYDIA
Chlamydia: NEGATIVE
Neisseria Gonorrhea: NEGATIVE

## 2021-09-16 LAB — OB RESULTS CONSOLE HEPATITIS B SURFACE ANTIGEN: Hepatitis B Surface Ag: NEGATIVE

## 2021-09-16 LAB — HEPATITIS C ANTIBODY: HCV Ab: NEGATIVE

## 2021-09-16 LAB — OB RESULTS CONSOLE ANTIBODY SCREEN: Antibody Screen: NEGATIVE

## 2021-09-16 LAB — OB RESULTS CONSOLE ABO/RH: RH Type: POSITIVE

## 2021-09-16 LAB — OB RESULTS CONSOLE RUBELLA ANTIBODY, IGM: Rubella: IMMUNE

## 2021-09-25 ENCOUNTER — Ambulatory Visit (INDEPENDENT_AMBULATORY_CARE_PROVIDER_SITE_OTHER): Payer: BC Managed Care – PPO | Admitting: Psychology

## 2021-09-25 DIAGNOSIS — F411 Generalized anxiety disorder: Secondary | ICD-10-CM | POA: Diagnosis not present

## 2021-09-25 NOTE — Progress Notes (Signed)
[facility] Counselor/Therapist Progress Note  Patient ID: Bailey Reyes, MRN: [id number],    Date: 09/25/2021  Time Spent: 50 mins  Treatment Type: Individual Therapy  Reported Symptoms: Pt presented for the follow up session, via webex video, due to virus outbreak.  Pt granted consent for session, stating that she is in her home with no one else present.  I shared with pt that I am in my office with no one else present here.  Pt shares she did not sleep well Wednesday night due to our dog having an ear infection; "I am still tired from that lack of sleep."    Mental Status Exam: Appearance:  Casual     Behavior: Appropriate  Motor: Normal  Speech/Language:  Clear and Coherent  Affect: Appropriate  Mood: normal  Thought process: normal  Thought content:   WNL  Sensory/Perceptual disturbances:   WNL  Orientation: oriented to person, place, and time/date  Attention: Good  Concentration: Good  Memory: WNL  Fund of knowledge:  Good  Insight:   Good  Judgment:  Good  Impulse Control: Good   Risk Assessment: Danger to Self:  No Self-injurious Behavior: No Danger to Others: No Duty to Warn:no Physical Aggression / Violence:No  Access to Firearms a concern: No  Gang Involvement:No   Subjective: Pt shares that she is planning to go to a dog park today with their dog and friends and their dog.  Pt shares that Bailey Reyes left Wed for a work trip to [place] and he will be back on Sunday.  Pt shares she continues to be tired from the pregnancy.  Pt shares she had a history of "crippling anxiety when I a few years ago, back in 2017 and 2018.  I took anxiety meds for about a year."  Pt shares that her anxiety is coming back with the pregnancy.  "I am easily scared; I am a big rule follower.  I read that if I get a fever, it can cause birth defects.  As a result of learning this, I am now afraid to go anywhere because I don't want to get sick from someone else."  Pt is also afraid  to eat out as much as in the past.  We talked about ways to manage these anxieties and pt seems to feel better about her concerns.  Pt shares that her sleep has been good, needs to walk more, listening to true crime podcasts, etc.  Pt shares that her grandparents are still very excited about her pregnancy.  Bailey Reyes's parents have still not said very much about her pregnancy and that is hurtful for pt.  She wants them to be excited about this first grandchild.  Encouraged pt to consider talking with Bailey Reyes's mom about pt's surprise about their reaction to the baby news to see if there are reasons that she is not aware of.  Also encouraged pt to be as intentional as possible about her ongoing self care activities.  We will meet in 3 wks for a follow up session.   Interventions: Cognitive Behavioral Therapy  Diagnosis:Generalized anxiety disorder  Plan: Treatment Plan Strengths/Abilities:  Intelligent, Intuitive, Willing to participate in therapy Treatment Preferences:  Outpatient Individual Therapy Statement of Needs:  Patient is to use CBT, mindfulness and coping skills to help manage and/or decrease symptoms associated with their diagnosis. Symptoms:  Depressed/Irritable mood, worry, social withdrawal Problems Addressed:  Depressive thoughts, Sadness, Sleep issues, etc. Long Term Goals:  Pt to reduce overall level, frequency,  and intensity of the feelings of anxiety as evidenced by decreased irritability, negative self talk, and helpless feelings from 6 to 7 days/week to 0 to 1 days/week, per client report, for at least 3 consecutive months.  Progress: 25% Short Term Goals:  Pt to verbally express understanding of the relationship between feelings of anxiety and their impact on thinking patterns and behaviors.  Pt to verbalize an understanding of the role that distorted thinking plays in creating fears, excessive worry, and ruminations.  Progress: 25% Target Date:  08/14/2022 Frequency:   Bi-weekly Modality:  Cognitive Behavioral Therapy Interventions by Therapist:  Therapist will use CBT, Mindfulness exercises, Coping skills and Referrals, as needed by client. Client has verbally approved this treatment plan.  Karie Kirks, [facility]

## 2021-10-08 DIAGNOSIS — Z368A Encounter for antenatal screening for other genetic defects: Secondary | ICD-10-CM | POA: Diagnosis not present

## 2021-10-14 ENCOUNTER — Ambulatory Visit (INDEPENDENT_AMBULATORY_CARE_PROVIDER_SITE_OTHER): Payer: BC Managed Care – PPO | Admitting: Psychology

## 2021-10-14 DIAGNOSIS — F411 Generalized anxiety disorder: Secondary | ICD-10-CM

## 2021-10-14 NOTE — Progress Notes (Signed)
[facility] Counselor/Therapist Progress Note  Patient ID: ASTARIA NANEZ, MRN: [id number],    Date: 10/14/2021  Time Spent: 50 mins  Treatment Type: Individual Therapy  Reported Symptoms: Pt presented for the follow up session, via webex video, due to virus outbreak.  Pt granted consent for session, stating that she is in her home with no one else present.  I shared with pt that I am in my office with no one else present here.     Mental Status Exam: Appearance:  Casual     Behavior: Appropriate  Motor: Normal  Speech/Language:  Clear and Coherent  Affect: Appropriate  Mood: normal  Thought process: normal  Thought content:   WNL  Sensory/Perceptual disturbances:   WNL  Orientation: oriented to person, place, and time/date  Attention: Good  Concentration: Good  Memory: WNL  Fund of knowledge:  Good  Insight:   Good  Judgment:  Good  Impulse Control: Good   Risk Assessment: Danger to Self:  No Self-injurious Behavior: No Danger to Others: No Duty to Warn:no Physical Aggression / Violence:No  Access to Firearms a concern: No  Gang Involvement:No   Subjective: Pt shares that she and Tinnie Gens went to dinner with his family on Friday night for Valentine's dinner.  Tinnie Gens sent her flowers yesterday and she appreciated those.  She also got the genetic testing results for the baby and they are having a girl; all of the other results were negative as well; pt is very happy about the results as is Tinnie Gens.  Pt shares that they have a girl name picked out and they will call her "Molly."  Pt received the testing results for her earlier in the month and everything was good with those results.  Pt shares that she is now [redacted] wks pregnant; 3 wks ago she felt really badly but only for a few days.  Pt shares that Jeffrey's parents are much more engaged with her and her pregnancy since our last visit.  She is now feeling much better about this situation.  Pt shares she did not  sleep well last night; these nights are infrequent; her energy level is really low at this point.  Pt has been taking their dog for a walk on nicer weather days and that seems to help her.  Pt shares that Tinnie Gens is scheduled for a certification test for his work and that is stressful for him.  Pt shares that they saw the doctor last week and they heard the heartbeat for the first time; it was thrilling for them.  Pt also shares that she is much less anxious about her pregnancy now that she has gotten so much info back and has had more doctors' appts.  She has made her baby registry online and she enjoyed that.  Pt shares that she is less anxious about eating out now than before; congratulated pt on managing her anxiety through this process.  Pt shares that she is still a little anxious about travelling; they are going to [facility] and [facility] and she is also going to the [employer] in [place]; both trips are in April.  Pt shares she has been trying to walk as much as the weather will allow, she has been taking her dog to the dog park, and she has been planning their baby registry and the nursery.  Encouraged pt to continue with her self care activities and we will meet in 3 wks for a follow up session.  Interventions: Cognitive Behavioral Therapy  Diagnosis:Generalized anxiety disorder  Plan: Treatment Plan Strengths/Abilities:  Intelligent, Intuitive, Willing to participate in therapy Treatment Preferences:  Outpatient Individual Therapy Statement of Needs:  Patient is to use CBT, mindfulness and coping skills to help manage and/or decrease symptoms associated with their diagnosis. Symptoms:  Depressed/Irritable mood, worry, social withdrawal Problems Addressed:  Depressive thoughts, Sadness, Sleep issues, etc. Long Term Goals:  Pt to reduce overall level, frequency, and intensity of the feelings of anxiety as evidenced by decreased irritability, negative self talk, and helpless feelings  from 6 to 7 days/week to 0 to 1 days/week, per client report, for at least 3 consecutive months.  Progress: 25% Short Term Goals:  Pt to verbally express understanding of the relationship between feelings of anxiety and their impact on thinking patterns and behaviors.  Pt to verbalize an understanding of the role that distorted thinking plays in creating fears, excessive worry, and ruminations.  Progress: 25% Target Date:  08/14/2022 Frequency:  Bi-weekly Modality:  Cognitive Behavioral Therapy Interventions by Therapist:  Therapist will use CBT, Mindfulness exercises, Coping skills and Referrals, as needed by client. Client has verbally approved this treatment plan.  Karie Kirks, [facility]               Karie Kirks, [facility]

## 2021-11-04 ENCOUNTER — Ambulatory Visit: Payer: BC Managed Care – PPO | Admitting: Psychology

## 2021-11-24 DIAGNOSIS — O4442 Low lying placenta NOS or without hemorrhage, second trimester: Secondary | ICD-10-CM | POA: Diagnosis not present

## 2021-11-24 DIAGNOSIS — Z3A19 19 weeks gestation of pregnancy: Secondary | ICD-10-CM | POA: Diagnosis not present

## 2021-11-24 DIAGNOSIS — Z363 Encounter for antenatal screening for malformations: Secondary | ICD-10-CM | POA: Diagnosis not present

## 2021-12-09 ENCOUNTER — Ambulatory Visit (INDEPENDENT_AMBULATORY_CARE_PROVIDER_SITE_OTHER): Payer: BC Managed Care – PPO | Admitting: Psychology

## 2021-12-09 DIAGNOSIS — F411 Generalized anxiety disorder: Secondary | ICD-10-CM | POA: Diagnosis not present

## 2021-12-09 NOTE — Progress Notes (Signed)
[facility] Counselor/Therapist Progress Note ? ?Patient ID: Bailey Reyes, MRN: [id number],   ? ?Date: 12/09/2021 ? ?Time Spent: 50 mins ? ?Treatment Type: Individual Therapy ? ?Reported Symptoms: Pt presented for the follow up session, via webex video, due to virus outbreak.  Pt granted consent for session, stating that she is in her home with no one else present.  I shared with pt that I am in my office with no one else present here.    ? ?Mental Status Exam: ?Appearance:  Casual     ?Behavior: Appropriate  ?Motor: Normal  ?Speech/Language:  Clear and Coherent  ?Affect: Appropriate  ?Mood: normal  ?Thought process: normal  ?Thought content:   WNL  ?Sensory/Perceptual disturbances:   WNL  ?Orientation: oriented to person, place, and time/date  ?Attention: Good  ?Concentration: Good  ?Memory: WNL  ?Fund of knowledge:  Good  ?Insight:   Good  ?Judgment:  Good  ?Impulse Control: Good  ? ?Risk Assessment: ?Danger to Self:  No ?Self-injurious Behavior: No ?Danger to Others: No ?Duty to Warn:no ?Physical Aggression / Violence:No  ?Access to Firearms a concern: No  ?Gang Involvement:No  ? ?Subjective: Pt shares that she has learned that [employer] changed her counseling benefits and she now owes $211.00 per session, instead of the $65.00 per session previously.  Pt shares that she has been doing well since our last session 2 months ago; she has only had one anxious episode and that was last week when she had a bleeding episode.  It ended pretty quickly and she had a follow up visit with the doctor two days later and she learned why it happened.  Baby [name] is doing great and is developing well.  Pt shares that she has been proud of herself for not being over-anxious with anything that is going on for her.  Pt shares she has been working out and walking regularly.  She has been "playing in the nursery and organizing her closet."  Pt also shares, "I have a strange sense of calm about the pregnancy; I have  always wanted to be a mom and I feel like I am doing a good job with it."  Pt is now [redacted] wks pregnant and everything is going well.  Pt shares that her grandmother is still being supportive of pt during her pregnancy.  She still finds herself being stressed around her grandparents because "I can never do anything right and I am always stressed out around them."  Pt shares that Dellis Filbert has been doing well at work.  She shares that his boss's 8 yo daughter committed suicide in March; she had been upset about having to move to [place] from another state.  Dellis Filbert and pt attended the young girl's funeral.  Dellis Filbert was effected by her passing and is now thinking in terms of what is best for their family rather than what is best for his career.  Congratulated pt for Jeffrey's maturity in his decision making.  Pt shares she is excited about going to the [employer] concert at the end of April; she shares some anxiety about driving to and from the concert.  Encouraged pt to continue with her self care activities and we will meet in 5 wks for a follow up session. ? ?Interventions: Cognitive Behavioral Therapy ? ?Diagnosis:Generalized anxiety disorder ? ?Plan: Treatment Plan ?Strengths/Abilities:  Intelligent, Intuitive, Willing to participate in therapy ?Treatment Preferences:  Outpatient Individual Therapy ?Statement of Needs:  Patient is to use CBT, mindfulness and  coping skills to help manage and/or decrease symptoms associated with their diagnosis. ?Symptoms:  Depressed/Irritable mood, worry, social withdrawal ?Problems Addressed:  Depressive thoughts, Sadness, Sleep issues, etc. ?Long Term Goals:  Pt to reduce overall level, frequency, and intensity of the feelings of anxiety as evidenced by decreased irritability, negative self talk, and helpless feelings from 6 to 7 days/week to 0 to 1 days/week, per client report, for at least 3 consecutive months.  Progress: 25% ?Short Term Goals:  Pt to verbally express understanding  of the relationship between feelings of anxiety and their impact on thinking patterns and behaviors.  Pt to verbalize an understanding of the role that distorted thinking plays in creating fears, excessive worry, and ruminations.  Progress: 25% ?Target Date:  08/14/2022 ?Frequency:  Bi-weekly ?Modality:  Cognitive Behavioral Therapy ?Interventions by Therapist:  Therapist will use CBT, Mindfulness exercises, Coping skills and Referrals, as needed by client. ?Client has verbally approved this treatment plan. ? ?Ivan Anchors, [facility] ?

## 2022-01-13 ENCOUNTER — Ambulatory Visit (INDEPENDENT_AMBULATORY_CARE_PROVIDER_SITE_OTHER): Payer: BC Managed Care – PPO | Admitting: Psychology

## 2022-01-13 DIAGNOSIS — F411 Generalized anxiety disorder: Secondary | ICD-10-CM

## 2022-01-13 NOTE — Progress Notes (Signed)
[facility] Counselor/Therapist Progress Note ? ?Patient ID: Bailey Reyes, MRN: [id number],   ? ?Date: 01/13/2022 ? ?Time Spent: 50 mins ? ?Treatment Type: Individual Therapy ? ?Reported Symptoms: Pt presented for the follow up session, via webex video.  Pt granted consent for session, stating that she is in her home with no one else present.  I shared with pt that I am in my office with no one else present here.    ? ?Mental Status Exam: ?Appearance:  Casual     ?Behavior: Appropriate  ?Motor: Normal  ?Speech/Language:  Clear and Coherent  ?Affect: Appropriate  ?Mood: normal  ?Thought process: normal  ?Thought content:   WNL  ?Sensory/Perceptual disturbances:   WNL  ?Orientation: oriented to person, place, and time/date  ?Attention: Good  ?Concentration: Good  ?Memory: WNL  ?Fund of knowledge:  Good  ?Insight:   Good  ?Judgment:  Good  ?Impulse Control: Good  ? ?Risk Assessment: ?Danger to Self:  No ?Self-injurious Behavior: No ?Danger to Others: No ?Duty to Warn:no ?Physical Aggression / Violence:No  ?Access to Firearms a concern: No  ?Gang Involvement:No  ? ?Subjective: Pt shares that she had a great time at the [employer]; "she played for three and a half hours and we got our money's worth."  Pt shares that other aspects of the trip were less enjoyable.  [name], her friend that went with her, is a difficult traveling partner.  Traffic was heavy the whole way to [place].  Pt shares she took responsibility for doing everything they wanted to do.  There were so many people in [place] for the weekend.  Pt shares that she did a good job driving to and from [place].  Pt shares that [name] continues to develop well and pt is doing well with the pregnancy; she is [redacted] wks pregnant at this point.  Encouraged pt to continue with her self care activities and we will meet in 5 wks for a follow up session. ? ?Interventions: Cognitive Behavioral Therapy ? ?Diagnosis:Generalized anxiety  disorder ? ?Plan: Treatment Plan ?Strengths/Abilities:  Intelligent, Intuitive, Willing to participate in therapy ?Treatment Preferences:  Outpatient Individual Therapy ?Statement of Needs:  Patient is to use CBT, mindfulness and coping skills to help manage and/or decrease symptoms associated with their diagnosis. ?Symptoms:  Depressed/Irritable mood, worry, social withdrawal ?Problems Addressed:  Depressive thoughts, Sadness, Sleep issues, etc. ?Long Term Goals:  Pt to reduce overall level, frequency, and intensity of the feelings of anxiety as evidenced by decreased irritability, negative self talk, and helpless feelings from 6 to 7 days/week to 0 to 1 days/week, per client report, for at least 3 consecutive months.  Progress: 25% ?Short Term Goals:  Pt to verbally express understanding of the relationship between feelings of anxiety and their impact on thinking patterns and behaviors.  Pt to verbalize an understanding of the role that distorted thinking plays in creating fears, excessive worry, and ruminations.  Progress: 25% ?Target Date:  08/14/2022 ?Frequency:  Bi-weekly ?Modality:  Cognitive Behavioral Therapy ?Interventions by Therapist:  Therapist will use CBT, Mindfulness exercises, Coping skills and Referrals, as needed by client. ?Client has verbally approved this treatment plan. ? ?Ivan Anchors, [facility] ?

## 2022-01-20 DIAGNOSIS — Z3689 Encounter for other specified antenatal screening: Secondary | ICD-10-CM | POA: Diagnosis not present

## 2022-01-20 DIAGNOSIS — Z23 Encounter for immunization: Secondary | ICD-10-CM | POA: Diagnosis not present

## 2022-01-26 DIAGNOSIS — O99810 Abnormal glucose complicating pregnancy: Secondary | ICD-10-CM | POA: Diagnosis not present

## 2022-02-03 ENCOUNTER — Encounter: Payer: BC Managed Care – PPO | Attending: Internal Medicine | Admitting: Registered"

## 2022-02-03 DIAGNOSIS — O24419 Gestational diabetes mellitus in pregnancy, unspecified control: Secondary | ICD-10-CM | POA: Insufficient documentation

## 2022-02-08 ENCOUNTER — Encounter: Payer: Self-pay | Admitting: Registered"

## 2022-02-08 DIAGNOSIS — O24419 Gestational diabetes mellitus in pregnancy, unspecified control: Secondary | ICD-10-CM | POA: Insufficient documentation

## 2022-02-08 NOTE — Progress Notes (Signed)
Patient was seen on 02/03/2022 for Gestational Diabetes self-management class at the [facility]. The following learning objectives were met by the patient during this course:  States the definition of Gestational Diabetes States why dietary management is important in controlling blood glucose Describes the effects each nutrient has on blood glucose levels Demonstrates ability to create a balanced meal plan Demonstrates carbohydrate counting  States when to check blood glucose levels Demonstrates proper blood glucose monitoring techniques States the effect of stress and exercise on blood glucose levels States the importance of limiting caffeine and abstaining from alcohol and smoking  Blood glucose monitor given: none, has meter and checking prior to class  Patient instructed to monitor glucose levels: FBS: 60 - <95; 1 hour: <140; 2 hour: <120  Patient received handouts: Nutrition Diabetes and Pregnancy, including carb counting list  Patient will be seen for follow-up as needed.

## 2022-02-17 ENCOUNTER — Ambulatory Visit (INDEPENDENT_AMBULATORY_CARE_PROVIDER_SITE_OTHER): Payer: BC Managed Care – PPO | Admitting: Psychology

## 2022-02-17 DIAGNOSIS — F411 Generalized anxiety disorder: Secondary | ICD-10-CM

## 2022-02-17 NOTE — Progress Notes (Signed)
[facility] Counselor/Therapist Progress Note  Patient ID: Bailey Reyes, MRN: [id number],    Date: 02/17/2022  Time Spent: 50 mins  Treatment Type: Individual Therapy  Reported Symptoms: Pt presented for the follow up session, via webex video.  Pt granted consent for session, stating that she is in her home with no one else present.  I shared with pt that I am in my office with no one else present here.     Mental Status Exam: Appearance:  Casual     Behavior: Appropriate  Motor: Normal  Speech/Language:  Clear and Coherent  Affect: Appropriate  Mood: normal  Thought process: normal  Thought content:   WNL  Sensory/Perceptual disturbances:   WNL  Orientation: oriented to person, place, and time/date  Attention: Good  Concentration: Good  Memory: WNL  Fund of knowledge:  Good  Insight:   Good  Judgment:  Good  Impulse Control: Good   Risk Assessment: Danger to Self:  No Self-injurious Behavior: No Danger to Others: No Duty to Warn:no Physical Aggression / Violence:No  Access to Firearms a concern: No  Gang Involvement:No   Subjective: Pt shares that she shares that "since our last session, I have been diagnosed with with gestational diabetes and that is frustrating for me."  Pt shares that she has been able to adjust to it and has made appropriate adjustments to the diagnosis.  Pt shares that her frustrations tend to build up "and when they build up I tend to shut down.  Tinnie Gens knows to give me space to process my feelings.  Sometimes, after a few days, Tinnie Gens tells me to get over it."  Pt shares that they did have a baby shower and she shares it was a beautiful event.  Pt shares that her grandparents came over to their home to celebrate Father's Day.  Pt and Tinnie Gens are considering moving in the next year.  Pt shares that Tinnie Gens continues to be very supportive of her and she appreciates that from him.  Pt is due to deliver [facility] in 9 wks. She has an [place]  scheduled for next week.  Encouraged pt to continue with her self care activities and we will meet in 5 wks for a follow up session.  Interventions: Cognitive Behavioral Therapy  Diagnosis:No diagnosis found.  Plan: Treatment Plan Strengths/Abilities:  Intelligent, Intuitive, Willing to participate in therapy Treatment Preferences:  Outpatient Individual Therapy Statement of Needs:  Patient is to use CBT, mindfulness and coping skills to help manage and/or decrease symptoms associated with their diagnosis. Symptoms:  Depressed/Irritable mood, worry, social withdrawal Problems Addressed:  Depressive thoughts, Sadness, Sleep issues, etc. Long Term Goals:  Pt to reduce overall level, frequency, and intensity of the feelings of anxiety as evidenced by decreased irritability, negative self talk, and helpless feelings from 6 to 7 days/week to 0 to 1 days/week, per client report, for at least 3 consecutive months.  Progress: 25% Short Term Goals:  Pt to verbally express understanding of the relationship between feelings of anxiety and their impact on thinking patterns and behaviors.  Pt to verbalize an understanding of the role that distorted thinking plays in creating fears, excessive worry, and ruminations.  Progress: 25% Target Date:  08/14/2022 Frequency:  Bi-weekly Modality:  Cognitive Behavioral Therapy Interventions by Therapist:  Therapist will use CBT, Mindfulness exercises, Coping skills and Referrals, as needed by client. Client has verbally approved this treatment plan.  Karie Kirks, [facility]

## 2022-02-23 DIAGNOSIS — O4443 Low lying placenta NOS or without hemorrhage, third trimester: Secondary | ICD-10-CM | POA: Diagnosis not present

## 2022-02-23 DIAGNOSIS — Z3A32 32 weeks gestation of pregnancy: Secondary | ICD-10-CM | POA: Diagnosis not present

## 2022-03-05 ENCOUNTER — Encounter: Payer: Self-pay | Admitting: Internal Medicine

## 2022-03-11 ENCOUNTER — Other Ambulatory Visit: Payer: Self-pay

## 2022-03-11 ENCOUNTER — Inpatient Hospital Stay (HOSPITAL_COMMUNITY)
Admission: AD | Admit: 2022-03-11 | Discharge: 2022-03-11 | Disposition: A | Discharge status: Home / Self Care | Payer: BC Managed Care – PPO | Attending: Obstetrics and Gynecology | Admitting: Obstetrics and Gynecology

## 2022-03-11 ENCOUNTER — Encounter (HOSPITAL_COMMUNITY): Payer: Self-pay | Admitting: Obstetrics and Gynecology

## 2022-03-11 DIAGNOSIS — O368190 Decreased fetal movements, unspecified trimester, not applicable or unspecified: Secondary | ICD-10-CM

## 2022-03-11 DIAGNOSIS — O368130 Decreased fetal movements, third trimester, not applicable or unspecified: Secondary | ICD-10-CM | POA: Insufficient documentation

## 2022-03-11 DIAGNOSIS — Z3A34 34 weeks gestation of pregnancy: Secondary | ICD-10-CM

## 2022-03-11 DIAGNOSIS — Z3689 Encounter for other specified antenatal screening: Secondary | ICD-10-CM | POA: Diagnosis not present

## 2022-03-11 HISTORY — DX: Anxiety disorder, unspecified: F41.9

## 2022-03-11 NOTE — MAU Note (Signed)
Bailey Reyes is a 31 y.o. at 109w2d here in MAU reporting: movements has not been as "sharp or strong", is moving just not as often. No pain, bleeding, or LOF.  Pt has FDM Onset of complaint: 2 days Pain score none Vitals:   03/11/22 1717  BP: 115/74  Pulse: 77  Resp: 17  Temp: 98.4 F (36.9 C)  SpO2: 99%     FHT:142 Lab orders placed from triage:  none

## 2022-03-12 NOTE — MAU Provider Note (Signed)
History     CSN: [id number]  Arrival date and time: 03/11/22 1707   Event Date/Time   First Provider Initiated Contact with Patient 03/11/22 1751      Chief Complaint  Patient presents with   Decreased Fetal Movement   HPI  Ms. Bailey Reyes is a 31 y.o. female G1P0 @ [redacted]w[redacted]d here in MAU with complaints of decreased fetal movement. She feels like the movements are less strong than they normally are. She has felt 4 movements since she has been on the monitor in MAU. She feels her baby move during her regular move times, however the movements are less strong. No bleeding.   OB History     Gravida  1   Para      Term      Preterm      AB      Living         SAB      IAB      Ectopic      Multiple      Live Births              Past Medical History:  Diagnosis Date   ADD (attention deficit disorder)    dx in college  sx since grade school had ha with med     Anxiety    Exercise-induced asthma    emprici dx  given  inhaler prn no current use    Past Surgical History:  Procedure Laterality Date   HARDWARE REMOVAL Left 12/11/2020   Procedure: REMOVAL HARDWARE FROM LEFT CLAVICLE;  Surgeon: Bjorn Pippin, MD;  Location: [facility];  Service: Orthopedics;  Laterality: Left;   ORIF CLAVICULAR FRACTURE Left 06/05/2020   Procedure: OPEN REDUCTION INTERNAL FIXATION (ORIF) CLAVICULAR FRACTURE;  Surgeon: Bjorn Pippin, MD;  Location: [facility];  Service: Orthopedics;  Laterality: Left;   TONSILLECTOMY AND ADENOIDECTOMY  2011    Family History  Problem Relation Age of Onset   Rheum arthritis Maternal Grandmother        on    Healthy Mother    Healthy Father     Social History   Tobacco Use   Smoking status: Never   Smokeless tobacco: Never  Vaping Use   Vaping Use: Never used  Substance Use Topics   Alcohol use: Yes    Alcohol/week: 0.0 standard drinks of alcohol    Comment: average 2 drinks weekly   Drug use: No     Allergies: No Known Allergies  No medications prior to admission.    Review of Systems  Constitutional:  Negative for fever.  Gastrointestinal:  Negative for abdominal pain.  Genitourinary:  Negative for vaginal bleeding and vaginal discharge.   Physical Exam   Blood pressure 120/72, pulse 68, temperature 98.4 F (36.9 C), temperature source Oral, resp. rate 14, height 5\' 5"  (1.651 m), weight 76.7 kg, SpO2 98 %.  Physical Exam Constitutional:      General: She is not in acute distress.    Appearance: Normal appearance. She is not ill-appearing, toxic-appearing or diaphoretic.  Musculoskeletal:        General: Normal range of motion.  Skin:    General: Skin is warm.  Neurological:     Mental Status: She is alert and oriented to person, place, and time.  Psychiatric:        Mood and Affect: Mood normal.    Fetal Tracing: Baseline: 130 bpm Variability: Moderate  Accelerations: 15x15  Decelerations: None Toco:  None  MAU Course  Procedures None  MDM  Reactive NST  Assessment and Plan   A:  1. Decreased fetal movement during pregnancy, antepartum, single or unspecified fetus   2. NST (non-stress test) reactive   3. [redacted] weeks gestation of pregnancy      P:  Dc home Return to MAU if symptoms worsen Anterior placenta, may contribute  Jhovani Griswold, Harolyn Rutherford, NP 03/12/2022 5:04 PM

## 2022-03-12 NOTE — Progress Notes (Signed)
Decreased fetal movement 

## 2022-03-24 ENCOUNTER — Ambulatory Visit: Payer: BC Managed Care – PPO | Admitting: Psychology

## 2022-03-25 DIAGNOSIS — Z3685 Encounter for antenatal screening for Streptococcus B: Secondary | ICD-10-CM | POA: Diagnosis not present

## 2022-03-25 LAB — OB RESULTS CONSOLE GBS: GBS: NEGATIVE

## 2022-04-13 ENCOUNTER — Encounter (HOSPITAL_COMMUNITY): Payer: Self-pay | Admitting: *Deleted

## 2022-04-13 ENCOUNTER — Telehealth (HOSPITAL_COMMUNITY): Payer: Self-pay | Admitting: *Deleted

## 2022-04-13 ENCOUNTER — Encounter: Payer: Self-pay | Admitting: Internal Medicine

## 2022-04-13 NOTE — Telephone Encounter (Signed)
Preadmission screen  

## 2022-04-18 ENCOUNTER — Other Ambulatory Visit: Payer: Self-pay | Admitting: Obstetrics and Gynecology

## 2022-04-18 DIAGNOSIS — O24410 Gestational diabetes mellitus in pregnancy, diet controlled: Secondary | ICD-10-CM

## 2022-04-18 NOTE — H&P (Unsigned)
Bailey Reyes is a 31 y.o. female G1P0 at 66 6/7 weeks (EDD 04/20/22 by LMP c/w 9 week [place]) presenting for IOL with gestational diabetes well-controlled by diet.  Prenatal care otherwise significant for:  1) Low lying placenta  RESOLVED at 32 weeks 2) Asthma  exercise-induced only  OB History     Gravida  1   Para      Term      Preterm      AB      Living         SAB      IAB      Ectopic      Multiple      Live Births             Past Medical History:  Diagnosis Date   ADD (attention deficit disorder)    dx in college  sx since grade school had ha with med     Anxiety    Exercise-induced asthma    emprici dx  given  inhaler prn no current use   Gestational diabetes    Past Surgical History:  Procedure Laterality Date   HARDWARE REMOVAL Left 12/11/2020   Procedure: REMOVAL HARDWARE FROM LEFT CLAVICLE;  Surgeon: Bjorn Pippin, MD;  Location: [facility];  Service: Orthopedics;  Laterality: Left;   ORIF CLAVICULAR FRACTURE Left 06/05/2020   Procedure: OPEN REDUCTION INTERNAL FIXATION (ORIF) CLAVICULAR FRACTURE;  Surgeon: Bjorn Pippin, MD;  Location: [facility];  Service: Orthopedics;  Laterality: Left;   TONSILLECTOMY AND ADENOIDECTOMY  2011   Family History: family history includes Healthy in her father and mother; Rheum arthritis in her maternal grandmother. Social History:  reports that she has never smoked. She has never used smokeless tobacco. She reports current alcohol use. She reports that she does not use drugs.     Maternal Diabetes: No Genetic Screening: Normal Maternal Ultrasounds/Referrals: Normal Fetal Ultrasounds or other Referrals:  None Maternal Substance Abuse:  No Significant Maternal Medications:  None Significant Maternal Lab Results:  Group B Strep negative Number of Prenatal Visits:greater than 3 verified prenatal visits Other Comments:  None  Review of Systems  Constitutional:  Negative for  fever.  Gastrointestinal:  Negative for abdominal pain.  Genitourinary:  Negative for vaginal bleeding.   Maternal Medical History:  Contractions: Frequency: irregular.   Perceived severity is mild.   Fetal activity: Perceived fetal activity is normal.   Prenatal Complications - Diabetes: gestational. Diabetes is managed by diet.       There were no vitals taken for this visit. Maternal Exam:  Uterine Assessment: Contraction strength is mild.  Contraction frequency is irregular.  Abdomen: Patient reports no abdominal tenderness. Introitus: Normal vulva. Normal vagina.    Physical Exam Cardiovascular:     Rate and Rhythm: Normal rate and regular rhythm.     Heart sounds: Normal heart sounds.  Pulmonary:     Effort: Pulmonary effort is normal.  Genitourinary:    General: Normal vulva.  Neurological:     General: No focal deficit present.     Mental Status: She is alert.  Psychiatric:        Mood and Affect: Mood normal.     Prenatal labs: ABO, Rh: O/Positive/-- (01/18 0000) Antibody: Negative (01/18 0000) Rubella: Immune (01/18 0000) RPR: Nonreactive (01/18 0000)  HBsAg: Negative (01/18 0000)  HIV: Non-reactive (01/18 0000)  GBS: Negative/-- (07/27 0000)  NIPT low risk, female Inheritest carrier screen negative x  3 Hgb AA  Assessment/Plan: Pt with well-controlled GDM on diet for IOL at term.  Plan pitocin and AROM.  Epidural prn.    Oliver Pila 04/18/2022, 1:47 PM

## 2022-04-19 ENCOUNTER — Inpatient Hospital Stay (HOSPITAL_COMMUNITY): Payer: BC Managed Care – PPO | Attending: Obstetrics and Gynecology

## 2022-04-19 ENCOUNTER — Inpatient Hospital Stay (HOSPITAL_COMMUNITY)
Admission: AD | Admit: 2022-04-19 | Discharge: 2022-04-21 | DRG: 807 | Disposition: A | Discharge status: Home / Self Care | Payer: BC Managed Care – PPO | Attending: Obstetrics and Gynecology | Admitting: Obstetrics and Gynecology

## 2022-04-19 ENCOUNTER — Inpatient Hospital Stay (HOSPITAL_COMMUNITY): Payer: BC Managed Care – PPO | Admitting: Anesthesiology

## 2022-04-19 ENCOUNTER — Other Ambulatory Visit: Payer: Self-pay

## 2022-04-19 ENCOUNTER — Inpatient Hospital Stay (HOSPITAL_COMMUNITY)
Admission: AD | Admit: 2022-04-19 | Payer: BC Managed Care – PPO | Source: Home / Self Care | Admitting: Obstetrics and Gynecology

## 2022-04-19 ENCOUNTER — Encounter (HOSPITAL_COMMUNITY): Payer: Self-pay | Admitting: Obstetrics and Gynecology

## 2022-04-19 DIAGNOSIS — O24420 Gestational diabetes mellitus in childbirth, diet controlled: Secondary | ICD-10-CM | POA: Diagnosis not present

## 2022-04-19 DIAGNOSIS — Z3A39 39 weeks gestation of pregnancy: Secondary | ICD-10-CM | POA: Diagnosis not present

## 2022-04-19 DIAGNOSIS — O24419 Gestational diabetes mellitus in pregnancy, unspecified control: Principal | ICD-10-CM | POA: Diagnosis present

## 2022-04-19 DIAGNOSIS — O24410 Gestational diabetes mellitus in pregnancy, diet controlled: Secondary | ICD-10-CM

## 2022-04-19 DIAGNOSIS — O2492 Unspecified diabetes mellitus in childbirth: Secondary | ICD-10-CM | POA: Diagnosis not present

## 2022-04-19 LAB — CBC
HCT: 37.2 % (ref 36.0–46.0)
Hemoglobin: 12.7 g/dL (ref 12.0–15.0)
MCH: 31.9 pg (ref 26.0–34.0)
MCHC: 34.1 g/dL (ref 30.0–36.0)
MCV: 93.5 fL (ref 80.0–100.0)
Platelets: 116 10*3/uL — ABNORMAL LOW (ref 150–400)
RBC: 3.98 MIL/uL (ref 3.87–5.11)
RDW: 12.8 % (ref 11.5–15.5)
WBC: 7.4 10*3/uL (ref 4.0–10.5)
nRBC: 0 % (ref 0.0–0.2)

## 2022-04-19 LAB — BASIC METABOLIC PANEL
Anion gap: 6 (ref 5–15)
BUN: 9 mg/dL (ref 6–20)
CO2: 23 mmol/L (ref 22–32)
Calcium: 8.7 mg/dL — ABNORMAL LOW (ref 8.9–10.3)
Chloride: 106 mmol/L (ref 98–111)
Creatinine, Ser: 0.64 mg/dL (ref 0.44–1.00)
GFR, Estimated: >60 mL/min (ref >60)
Glucose, Bld: 81 mg/dL (ref 70–99)
Potassium: 3.5 mmol/L (ref 3.5–5.1)
Sodium: 135 mmol/L (ref 135–145)

## 2022-04-19 LAB — GLUCOSE, CAPILLARY
Glucose-Capillary: 75 mg/dL (ref 70–99)
Glucose-Capillary: 76 mg/dL (ref 70–99)
Glucose-Capillary: 122 mg/dL — ABNORMAL HIGH (ref 70–99)

## 2022-04-19 LAB — TYPE AND SCREEN
ABO/RH(D): O POS
Antibody Screen: NEGATIVE

## 2022-04-19 LAB — RPR: RPR Ser Ql: NONREACTIVE

## 2022-04-19 MED ORDER — PRENATAL MULTIVITAMIN CH
1.0000 | ORAL_TABLET | Freq: Every day | ORAL | Status: DC
  Administered 2022-04-20 – 2022-04-21 (×2): 1 via ORAL
  Filled 2022-04-19 (×2): qty 1

## 2022-04-19 MED ORDER — IBUPROFEN 600 MG PO TABS
600.0000 mg | ORAL_TABLET | Freq: Four times a day (QID) | ORAL | Status: DC
  Administered 2022-04-19 – 2022-04-21 (×7): 600 mg via ORAL
  Filled 2022-04-19 (×7): qty 1

## 2022-04-19 MED ORDER — LIDOCAINE HCL (PF) 1 % IJ SOLN
INTRAMUSCULAR | Status: DC | PRN
  Administered 2022-04-19: 8 mL via EPIDURAL

## 2022-04-19 MED ORDER — TERBUTALINE SULFATE 1 MG/ML IJ SOLN: 0.2500 mg | Freq: Once | INTRAMUSCULAR | Status: DC | PRN

## 2022-04-19 MED ORDER — OXYTOCIN-SODIUM CHLORIDE 30-0.9 UT/500ML-% IV SOLN
2.5000 [IU]/h | INTRAVENOUS | Status: DC
  Administered 2022-04-19: 2.5 [IU]/h via INTRAVENOUS

## 2022-04-19 MED ORDER — ACETAMINOPHEN 325 MG PO TABS: 650.0000 mg | ORAL_TABLET | ORAL | Status: DC | PRN

## 2022-04-19 MED ORDER — ZOLPIDEM TARTRATE 5 MG PO TABS: 5.0000 mg | ORAL_TABLET | Freq: Every evening | ORAL | Status: DC | PRN

## 2022-04-19 MED ORDER — PHENYLEPHRINE 80 MCG/ML (10ML) SYRINGE FOR IV PUSH (FOR BLOOD PRESSURE SUPPORT)
80.0000 ug | PREFILLED_SYRINGE | INTRAVENOUS | Status: DC | PRN
  Filled 2022-04-19: qty 10

## 2022-04-19 MED ORDER — EPHEDRINE 5 MG/ML INJ: 10.0000 mg | INTRAVENOUS | Status: DC | PRN

## 2022-04-19 MED ORDER — LACTATED RINGERS IV SOLN
500.0000 mL | Freq: Once | INTRAVENOUS | Status: AC
  Administered 2022-04-19: 500 mL via INTRAVENOUS

## 2022-04-19 MED ORDER — SIMETHICONE 80 MG PO CHEW: 80.0000 mg | CHEWABLE_TABLET | ORAL | Status: DC | PRN

## 2022-04-19 MED ORDER — WITCH HAZEL-GLYCERIN EX PADS: 1.0000 | MEDICATED_PAD | CUTANEOUS | Status: DC | PRN

## 2022-04-19 MED ORDER — OXYTOCIN BOLUS FROM INFUSION
333.0000 mL | Freq: Once | INTRAVENOUS | Status: AC
  Administered 2022-04-19: 333 mL via INTRAVENOUS

## 2022-04-19 MED ORDER — COCONUT OIL OIL: 1.0000 | TOPICAL_OIL | Status: DC | PRN

## 2022-04-19 MED ORDER — TETANUS-DIPHTH-ACELL PERTUSSIS 5-2.5-18.5 LF-MCG/0.5 IM SUSY: 0.5000 mL | PREFILLED_SYRINGE | Freq: Once | INTRAMUSCULAR | Status: DC

## 2022-04-19 MED ORDER — BENZOCAINE-MENTHOL 20-0.5 % EX AERO
1.0000 | INHALATION_SPRAY | CUTANEOUS | Status: DC | PRN
  Administered 2022-04-20: 1 via TOPICAL
  Filled 2022-04-19: qty 56

## 2022-04-19 MED ORDER — OXYCODONE-ACETAMINOPHEN 5-325 MG PO TABS: 2.0000 | ORAL_TABLET | ORAL | Status: DC | PRN

## 2022-04-19 MED ORDER — LACTATED RINGERS IV SOLN: 500.0000 mL | INTRAVENOUS | Status: DC | PRN

## 2022-04-19 MED ORDER — LACTATED RINGERS IV SOLN: INTRAVENOUS | Status: DC

## 2022-04-19 MED ORDER — ALBUTEROL SULFATE (2.5 MG/3ML) 0.083% IN NEBU: 3.0000 mL | INHALATION_SOLUTION | Freq: Four times a day (QID) | RESPIRATORY_TRACT | Status: DC | PRN

## 2022-04-19 MED ORDER — PHENYLEPHRINE 80 MCG/ML (10ML) SYRINGE FOR IV PUSH (FOR BLOOD PRESSURE SUPPORT): 80.0000 ug | PREFILLED_SYRINGE | INTRAVENOUS | Status: DC | PRN

## 2022-04-19 MED ORDER — OXYTOCIN-SODIUM CHLORIDE 30-0.9 UT/500ML-% IV SOLN
1.0000 m[IU]/min | INTRAVENOUS | Status: DC
  Administered 2022-04-19: 2 m[IU]/min via INTRAVENOUS
  Filled 2022-04-19: qty 500

## 2022-04-19 MED ORDER — LIDOCAINE HCL (PF) 1 % IJ SOLN: 30.0000 mL | INTRAMUSCULAR | Status: DC | PRN

## 2022-04-19 MED ORDER — DIPHENHYDRAMINE HCL 25 MG PO CAPS: 25.0000 mg | ORAL_CAPSULE | Freq: Four times a day (QID) | ORAL | Status: DC | PRN

## 2022-04-19 MED ORDER — ONDANSETRON HCL 4 MG/2ML IJ SOLN
4.0000 mg | Freq: Four times a day (QID) | INTRAMUSCULAR | Status: DC | PRN
Start: 2022-04-19 — End: 2022-04-19

## 2022-04-19 MED ORDER — DIPHENHYDRAMINE HCL 50 MG/ML IJ SOLN: 12.5000 mg | INTRAMUSCULAR | Status: DC | PRN

## 2022-04-19 MED ORDER — OXYCODONE-ACETAMINOPHEN 5-325 MG PO TABS: 1.0000 | ORAL_TABLET | ORAL | Status: DC | PRN

## 2022-04-19 MED ORDER — ONDANSETRON HCL 4 MG PO TABS: 4.0000 mg | ORAL_TABLET | ORAL | Status: DC | PRN

## 2022-04-19 MED ORDER — ACETAMINOPHEN 325 MG PO TABS
650.0000 mg | ORAL_TABLET | ORAL | Status: DC | PRN
Start: 2022-04-19 — End: 2022-04-19

## 2022-04-19 MED ORDER — FENTANYL-BUPIVACAINE-NACL 0.5-0.125-0.9 MG/250ML-% EP SOLN: 12.0000 mL/h | EPIDURAL | Status: DC | PRN

## 2022-04-19 MED ORDER — FENTANYL CITRATE (PF) 100 MCG/2ML IJ SOLN: 50.0000 ug | INTRAMUSCULAR | Status: DC | PRN

## 2022-04-19 MED ORDER — SOD CITRATE-CITRIC ACID 500-334 MG/5ML PO SOLN
30.0000 mL | ORAL | Status: DC | PRN
Start: 2022-04-19 — End: 2022-04-19

## 2022-04-19 MED ORDER — ONDANSETRON HCL 4 MG/2ML IJ SOLN: 4.0000 mg | INTRAMUSCULAR | Status: DC | PRN

## 2022-04-19 MED ORDER — FENTANYL-BUPIVACAINE-NACL 0.5-0.125-0.9 MG/250ML-% EP SOLN
12.0000 mL/h | EPIDURAL | Status: DC | PRN
  Administered 2022-04-19: 12 mL/h via EPIDURAL
  Filled 2022-04-19: qty 250

## 2022-04-19 MED ORDER — DIBUCAINE (PERIANAL) 1 % EX OINT: 1.0000 | TOPICAL_OINTMENT | CUTANEOUS | Status: DC | PRN

## 2022-04-19 MED ORDER — SENNOSIDES-DOCUSATE SODIUM 8.6-50 MG PO TABS
2.0000 | ORAL_TABLET | ORAL | Status: DC
  Administered 2022-04-20 – 2022-04-21 (×2): 2 via ORAL
  Filled 2022-04-19 (×2): qty 2

## 2022-04-19 NOTE — H&P (Signed)
Bailey Reyes is a 31 y.o. female G1P0 at 59 6/7 weeks (EDD 04/20/22 by LMP c/w 9 week [place]) presenting for IOL with gestational diabetes well-controlled by diet.  Prenatal care otherwise significant for:  1) Low lying placenta  RESOLVED at 32 weeks 2) Asthma  exercise-induced only  OB History     Gravida  1   Para      Term      Preterm      AB      Living         SAB      IAB      Ectopic      Multiple      Live Births             Past Medical History:  Diagnosis Date   ADD (attention deficit disorder)    dx in college  sx since grade school had ha with med     Anxiety    Exercise-induced asthma    emprici dx  given  inhaler prn no current use   Gestational diabetes    Past Surgical History:  Procedure Laterality Date   HARDWARE REMOVAL Left 12/11/2020   Procedure: REMOVAL HARDWARE FROM LEFT CLAVICLE;  Surgeon: Bjorn Pippin, MD;  Location: [facility];  Service: Orthopedics;  Laterality: Left;   ORIF CLAVICULAR FRACTURE Left 06/05/2020   Procedure: OPEN REDUCTION INTERNAL FIXATION (ORIF) CLAVICULAR FRACTURE;  Surgeon: Bjorn Pippin, MD;  Location: [facility];  Service: Orthopedics;  Laterality: Left;   TONSILLECTOMY AND ADENOIDECTOMY  2011   Family History: family history includes Healthy in her father and mother; Rheum arthritis in her maternal grandmother. Social History:  reports that she has never smoked. She has never used smokeless tobacco. She reports current alcohol use. She reports that she does not use drugs.     Maternal Diabetes: No Genetic Screening: Normal Maternal Ultrasounds/Referrals: Normal Fetal Ultrasounds or other Referrals:  None Maternal Substance Abuse:  No Significant Maternal Medications:  None Significant Maternal Lab Results:  Group B Strep negative Number of Prenatal Visits:greater than 3 verified prenatal visits Other Comments:  None  Review of Systems  Constitutional:  Negative for  fever.  Gastrointestinal:  Negative for abdominal pain.  Genitourinary:  Negative for vaginal bleeding.   Maternal Medical History:  Contractions: Frequency: irregular.   Perceived severity is mild.   Fetal activity: Perceived fetal activity is normal.   Prenatal Complications - Diabetes: gestational. Diabetes is managed by diet.     Dilation: 2.5 Effacement (%): 70 Station: -2 Exam by:: Dr. Senaida Ores AROM clear  Blood pressure 113/76, pulse 69, temperature 98.6 F (37 C), temperature source Oral, resp. rate 16, height 5\' 5"  (1.651 m), weight 77.5 kg, SpO2 99 %. Maternal Exam:  Uterine Assessment: Contraction strength is mild.  Contraction frequency is irregular.  Abdomen: Patient reports no abdominal tenderness. Introitus: Normal vulva. Normal vagina.    Physical Exam Cardiovascular:     Rate and Rhythm: Normal rate and regular rhythm.     Heart sounds: Normal heart sounds.  Pulmonary:     Effort: Pulmonary effort is normal.  Genitourinary:    General: Normal vulva.  Neurological:     General: No focal deficit present.     Mental Status: She is alert.  Psychiatric:        Mood and Affect: Mood normal.     Prenatal labs: ABO, Rh: --/--/PENDING (08/21 05-13-1987) Antibody: PENDING (08/21 0744) Rubella: Immune (  01/18 0000) RPR: Nonreactive (01/18 0000)  HBsAg: Negative (01/18 0000)  HIV: Non-reactive (01/18 0000)  GBS: Negative/-- (07/27 0000)  NIPT low risk, female Inheritest carrier screen negative x 3 Hgb AA  Assessment/Plan: Pt with well-controlled GDM on diet for IOL at term. Pt starting on pitocin.  She had thought she may be leaking fluid but membranes palpated intact and I performed AROM with exam.  Epidural prn. Oliver Pila 04/19/2022, 8:41 AM

## 2022-04-19 NOTE — Progress Notes (Signed)
Patient ID: Bailey Reyes, female   DOB: 1990/09/27, 31 y.o.   MRN: [id number] Pt has made steady progress and reached c/c/+2-+3 station Has begun pushing FHR category 1

## 2022-04-19 NOTE — MAU Note (Addendum)
.  Bailey Reyes is a 31 y.o. at [redacted]w[redacted]d here in MAU reporting: SROM at 35. Clear, watery fluid with some bloody show. Reports good FM. She also reports some pelvic pressure and lower back cramping (6/10). Patient reports that she was called in for an induction this morning and her water broke while getting ready to come in.   Onset of complaint: 0545 Pain score: 6/10 Vitals:   04/19/22 0644  BP: 119/75  Pulse: 76  Resp: 18  Temp: 98.6 F (37 C)  SpO2: 97%     FHT:140 Lab orders placed from triage:  MAU labor

## 2022-04-19 NOTE — Anesthesia Preprocedure Evaluation (Addendum)
Anesthesia Evaluation  Patient identified by MRN, date of birth, ID band Patient awake    Reviewed: Allergy & Precautions, H&P , NPO status , Patient's Chart, lab work & pertinent test results, reviewed documented beta blocker date and time   Airway Mallampati: II  TM Distance: >3 FB Neck ROM: full    Dental no notable dental hx.    Pulmonary neg pulmonary ROS,    Pulmonary exam normal breath sounds clear to auscultation       Cardiovascular negative cardio ROS Normal cardiovascular exam Rhythm:regular Rate:Normal     Neuro/Psych negative neurological ROS  negative psych ROS   GI/Hepatic negative GI ROS, Neg liver ROS,   Endo/Other  diabetes  Renal/GU negative Renal ROS  negative genitourinary   Musculoskeletal   Abdominal   Peds  Hematology negative hematology ROS (+)   Anesthesia Other Findings   Reproductive/Obstetrics (+) Pregnancy                            Anesthesia Physical Anesthesia Plan  ASA: 2  Anesthesia Plan: Epidural   Post-op Pain Management: Minimal or no pain anticipated   Induction:   PONV Risk Score and Plan: 2  Airway Management Planned: Natural Airway  Additional Equipment:   Intra-op Plan:   Post-operative Plan:   Informed Consent: I have reviewed the patients History and Physical, chart, labs and discussed the procedure including the risks, benefits and alternatives for the proposed anesthesia with the patient or authorized representative who has indicated his/her understanding and acceptance.     Dental Advisory Given  Plan Discussed with:   Anesthesia Plan Comments: (Labs checked- platelets confirmed with RN in room. Fetal heart tracing, per RN, reported to be stable enough for sitting procedure. Discussed epidural, and patient consents to the procedure:  included risk of possible headache,backache, failed block, allergic reaction, and nerve  injury. This patient was asked if she had any questions or concerns before the procedure started.)       Anesthesia Quick Evaluation

## 2022-04-19 NOTE — Progress Notes (Signed)
Patient ID: Bailey Reyes, female   DOB: 04/24/1991, 31 y.o.   MRN: [id number] Pt getting more uncomfortable, would like epidural  Afeb VSS FHR category 1  Cervix 3/75/-1  Epidural and titrate pitocin as needed

## 2022-04-19 NOTE — Anesthesia Procedure Notes (Signed)
Epidural Patient location during procedure: OB Start time: 04/19/2022 12:07 PM End time: 04/19/2022 12:10 PM  Staffing Anesthesiologist: Bethena Midget, MD  Preanesthetic Checklist Completed: patient identified, IV checked, site marked, risks and benefits discussed, surgical consent, monitors and equipment checked, pre-op evaluation and timeout performed  Epidural Patient position: sitting Prep: DuraPrep and site prepped and draped Patient monitoring: continuous pulse ox and blood pressure Approach: midline Location: L3-L4 Injection technique: LOR air  Needle:  Needle type: Tuohy  Needle gauge: 17 G Needle length: 9 cm and 9 Needle insertion depth: 5 cm cm Catheter type: closed end flexible Catheter size: 19 Gauge Catheter at skin depth: 10 cm Test dose: negative  Assessment Events: blood not aspirated, injection not painful, no injection resistance, no paresthesia and negative IV test

## 2022-04-19 NOTE — Lactation Note (Addendum)
This note was copied from a baby's chart. Lactation Consultation Note  Patient Name: Bailey Reyes Date: 04/19/2022 Reason for consult: L&D Initial assessment;Mother's request;Primapara;1st time breastfeeding;Term;Breastfeeding assistance Age:30 hours LC assisted with latching infant at the breast with signs of milk transfer.  Infant still feeding at the end of the visit.  Birth parent to get assistance further once on the floor.   Maternal Data Has patient been taught Hand Expression?: Yes  Feeding Mother's Current Feeding Choice: Breast Milk  LATCH Score Latch: Repeated attempts needed to sustain latch, nipple held in mouth throughout feeding, stimulation needed to elicit sucking reflex.  Audible Swallowing: Spontaneous and intermittent  Type of Nipple: Everted at rest and after stimulation  Comfort (Breast/Nipple): Soft / non-tender  Hold (Positioning): Assistance needed to correctly position infant at breast and maintain latch.  LATCH Score: 8   Lactation Tools Discussed/Used    Interventions Interventions: Breast feeding basics reviewed;Assisted with latch;Skin to skin;Breast massage;Hand express;Breast compression;Adjust position;Support pillows;Position options;Expressed milk;Education  Discharge    Consult Status Consult Status: Follow-up from L&D Date: 04/20/22 Follow-up type: In-patient    Bailey Wirz  Reyes 04/19/2022, 8:28 PM

## 2022-04-20 LAB — CBC
HCT: 36.5 % (ref 36.0–46.0)
Hemoglobin: 12.6 g/dL (ref 12.0–15.0)
MCH: 32.4 pg (ref 26.0–34.0)
MCHC: 34.5 g/dL (ref 30.0–36.0)
MCV: 93.8 fL (ref 80.0–100.0)
Platelets: 116 10*3/uL — ABNORMAL LOW (ref 150–400)
RBC: 3.89 MIL/uL (ref 3.87–5.11)
RDW: 13 % (ref 11.5–15.5)
WBC: 11.8 10*3/uL — ABNORMAL HIGH (ref 4.0–10.5)
nRBC: 0 % (ref 0.0–0.2)

## 2022-04-20 NOTE — Social Work (Addendum)
MOB was referred for history of anxiety and ADD.  * Referral screened out by Clinical Social Worker because none of the following criteria appear to apply:  ~ History of anxiety during this pregnancy, or of post-partum depression following prior delivery.   ~ Diagnosis of anxiety within last 3 years. Per chart review, MOB has remote history of anxiety in college. MOB manages anxiety and ADD without medications. MOB completed Edinburgh score 1. OR * MOB's symptoms currently being treated with medication and/or therapy.  Please contact the Clinical Social Worker if needs arise, by [facility] request, or if MOB scores greater than 9/yes to question 10 on Edinburgh Postpartum Depression Screen.   Vivi Barrack, MSW, LCSW [facility]  Clinical Social Worker  [phone] 04/20/2022  10:17 AM

## 2022-04-20 NOTE — Progress Notes (Signed)
Patient is eating, ambulating, voiding.  Pain control is good.  Appropriate lochia, no complaints.  Vitals:   04/20/22 0230 04/20/22 0612 04/20/22 0657 04/20/22 1020  BP: 120/78 (!) 118/91 115/82 127/84  Pulse: (!) 56 66 62 70  Resp: 17 19  20   Temp: 97.8 F (36.6 C) 97.7 F (36.5 C)  97.7 F (36.5 C)  TempSrc: Oral Oral  Oral  SpO2: 98% 99%  98%  Weight:      Height:       Gen: NAD Fundus firm Ext: no calf tenderness  Lab Results  Component Value Date   WBC 11.8 (H) 04/20/2022   HGB 12.6 04/20/2022   HCT 36.5 04/20/2022   MCV 93.8 04/20/2022   PLT 116 (L) 04/20/2022    --/--/O POS (08/21 0744)  A/P Post partum day 1. Doing well.  Routine care.  Expect d/c 8/23.    9/23

## 2022-04-20 NOTE — Lactation Note (Signed)
This note was copied from a baby's chart. Lactation Consultation Note  Patient Name: Bailey Reyes [id number] Date: 04/20/2022 Reason for consult: Initial assessment;Mother's request;Primapara;1st time breastfeeding;Term;Breastfeeding assistance;Maternal endocrine disorder Age:30 hours Birth parent stating latch going well with signs of milk transfer.  Infant still feeding at the end of the visit.  Plan 1. Birth parent feeding by cues 8-12x 24hr period. Birth parent offering breasts noting signs of milk transfer.  2. If unable to latch, birth parent to hand express and offer colostrum then try a latch.   Birth parent spectra pump at home.  All questions answered at the end of the visit.   Maternal Data Has patient been taught Hand Expression?: Yes  Feeding Mother's Current Feeding Choice: Breast Milk  LATCH Score Latch: Repeated attempts needed to sustain latch, nipple held in mouth throughout feeding, stimulation needed to elicit sucking reflex.  Audible Swallowing: Spontaneous and intermittent  Type of Nipple: Everted at rest and after stimulation  Comfort (Breast/Nipple): Soft / non-tender  Hold (Positioning): Assistance needed to correctly position infant at breast and maintain latch.  LATCH Score: 8   Lactation Tools Discussed/Used    Interventions Interventions: Breast feeding basics reviewed;Assisted with latch;Skin to skin;Breast massage;Hand express;Breast compression;Adjust position;Support pillows;Position options;Expressed milk;Education;LC Psychologist, educational;Infant Driven Feeding Algorithm education  Discharge Pump: Personal  Consult Status Consult Status: Follow-up Date: 04/21/22 Follow-up type: In-patient    Bailey Reyes  Bailey Reyes 04/20/2022, 10:57 PM

## 2022-04-20 NOTE — Anesthesia Postprocedure Evaluation (Signed)
Anesthesia Post Note  Patient: TKEYA STENCIL  Procedure(s) Performed: AN AD HOC LABOR EPIDURAL     Patient location during evaluation: Mother Baby Anesthesia Type: Epidural Level of consciousness: awake and alert Pain management: pain level controlled Vital Signs Assessment: post-procedure vital signs reviewed and stable Respiratory status: spontaneous breathing, nonlabored ventilation and respiratory function stable Cardiovascular status: stable Postop Assessment: no headache, no backache and epidural receding Anesthetic complications: no   No notable events documented.  Last Vitals:  Vitals:   04/20/22 0657 04/20/22 1020  BP: 115/82 127/84  Pulse: 62 70  Resp:  20  Temp:  36.5 C  SpO2:  98%    Last Pain:  Vitals:   04/20/22 1020  TempSrc: Oral  PainSc: 0-No pain   Pain Goal: Patients Stated Pain Goal: 3 (04/19/22 0756)                 Fanny Dance

## 2022-04-21 NOTE — Discharge Summary (Signed)
Postpartum Discharge Summary  Date of Service updated 04/21/22     Patient Name: Bailey Reyes DOB: 1991/08/22 MRN: [id number]  Date of admission: 04/19/2022 Delivery date:04/19/2022  Delivering provider: Huel Cote  Date of discharge: 04/21/2022  Admitting diagnosis: Gestational diabetes mellitus (GDM) affecting pregnancy [O24.419] NSVD (normal spontaneous vaginal delivery) [O80] Intrauterine pregnancy: [redacted]w[redacted]d     Secondary diagnosis:  Principal Problem:   Gestational diabetes mellitus (GDM) affecting pregnancy Active Problems:   NSVD (normal spontaneous vaginal delivery)  Additional problems: None    Discharge diagnosis: Term Pregnancy Delivered                                              Post partum procedures: n/a Augmentation: AROM and Pitocin Complications: None  Hospital course: Induction of Labor With Vaginal Delivery   31 y.o. yo G1P1001 at [redacted]w[redacted]d was admitted to the hospital 04/19/2022 for induction of labor.  Indication for induction: A1 DM.  Patient had an uncomplicated labor course as follows: Membrane Rupture Time/Date: 8:04 AM ,04/19/2022   Delivery Method:Vaginal, Spontaneous  Episiotomy: None  Lacerations:  2nd degree  Details of delivery can be found in separate delivery note.  Patient had a routine postpartum course. Patient is discharged home 04/21/22.  Newborn Data: Birth date:04/19/2022  Birth time:7:00 PM  Gender:Female  Living status:Living  Apgars:9 ,9  Weight:3062 g    Physical exam  Vitals:   04/20/22 0612 04/20/22 0657 04/20/22 1020 04/21/22 0540  BP: (!) 118/91 115/82 127/84 105/81  Pulse: 66 62 70 (!) 54  Resp: 19  20 17   Temp: 97.7 F (36.5 C)  97.7 F (36.5 C) 97.9 F (36.6 C)  TempSrc: Oral  Oral Oral  SpO2: 99%  98% 100%  Weight:      Height:       General: alert, cooperative, and no distress  Lochia: appropriate Uterine Fundus: firm Incision: N/A DVT Evaluation: No evidence of DVT seen on physical  exam. Labs: Lab Results  Component Value Date   WBC 11.8 (H) 04/20/2022   HGB 12.6 04/20/2022   HCT 36.5 04/20/2022   MCV 93.8 04/20/2022   PLT 116 (L) 04/20/2022      Latest Ref Rng & Units 04/19/2022    7:44 AM  CMP  Glucose 70 - 99 mg/dL 81   BUN 6 - 20 mg/dL 9   Creatinine 04/21/2022 - 8.41 mg/dL 3.24   Sodium 4.01 - 027 mmol/L 135   Potassium 3.5 - 5.1 mmol/L 3.5   Chloride 98 - 111 mmol/L 106   CO2 22 - 32 mmol/L 23   Calcium 8.9 - 10.3 mg/dL 8.7    Edinburgh Score:    04/20/2022    9:14 AM  Edinburgh Postnatal Depression Scale Screening Tool  I have been able to laugh and see the funny side of things. 0  I have looked forward with enjoyment to things. 0  I have blamed myself unnecessarily when things went wrong. 1  I have been anxious or worried for no good reason. 0  I have felt scared or panicky for no good reason. 0  Things have been getting on top of me. 0  I have been so unhappy that I have had difficulty sleeping. 0  I have felt sad or miserable. 0  I have been so unhappy that I have been crying. 0  The thought of harming myself has occurred to me. 0  Edinburgh Postnatal Depression Scale Total 1      After visit meds:  Allergies as of 04/21/2022   No Known Allergies      Medication List     TAKE these medications    albuterol 108 (90 Base) MCG/ACT inhaler Commonly known as: ProAir HFA Inhale 2 puffs into the lungs every 6 (six) hours as needed for wheezing or shortness of breath.   prenatal multivitamin Tabs tablet Take 1 tablet by mouth daily at 12 noon.         Discharge home in stable condition Infant Feeding: Breast Infant Disposition:home with mother Discharge instruction: per After Visit Summary and Postpartum booklet. Activity: Advance as tolerated. Pelvic rest for 6 weeks.  Diet: routine diet Anticipated Birth Control: Unsure Postpartum Appointment:6 weeks Additional Postpartum F/U:  n/a Future Appointments:No future  appointments. Follow up Visit:  Follow-up Information     Huel Cote, MD Follow up in 6 week(s).   Specialty: Obstetrics and Gynecology Contact information: [address] [place] [address] [phone]                     04/21/2022 [facility] Lizabeth Leyden, MD

## 2022-04-21 NOTE — Progress Notes (Signed)
Patient is doing well.  She is ambulating, voiding, tolerating PO.  Pain control is good.  Lochia is appropriate  Reports a small area on anterior thigh with decreased sensation.  No weakness with ambulation and decreased range of motion  Vitals:   04/20/22 0612 04/20/22 0657 04/20/22 1020 04/21/22 0540  BP: (!) 118/91 115/82 127/84 105/81  Pulse: 66 62 70 (!) 54  Resp: 19  20 17   Temp: 97.7 F (36.5 C)  97.7 F (36.5 C) 97.9 F (36.6 C)  TempSrc: Oral  Oral Oral  SpO2: 99%  98% 100%  Weight:      Height:        NAD Fundus firm Ext: no edema  Lab Results  Component Value Date   WBC 11.8 (H) 04/20/2022   HGB 12.6 04/20/2022   HCT 36.5 04/20/2022   MCV 93.8 04/20/2022   PLT 116 (L) 04/20/2022    --/--/O POS (08/21 0744)  A/P 31 y.o. G1P1001 PPD#2. Routine care.   Mild decreased sensation anterior thigh.  Feeding baby so unable to fully evaluation.  Pushed for only 40 minutes.  Discussed expectant management.  If no improvement over next few weeks, would consider outpatient referral to neurology at that time Meeting all goals.  Discharge to home today.    [facility]

## 2022-04-21 NOTE — Lactation Note (Signed)
This note was copied from a baby's chart. Lactation Consultation Note  Patient Name: Bailey Reyes Date: 04/21/2022 Reason for consult: Follow-up assessment;Primapara;1st time breastfeeding;Term;Infant weight loss;Nipple pain/trauma;Breastfeeding assistance (6 % weight loss. As LC entered the room, birth parent getting ready to feed. LC offered to assist and Birth parent receptive. Per Birth parent nipples are sensitive. LC assessed with feeding, no breakdown, and just areola edema.) Age:58 hours LC provided Birth parent with shells for between feedings except when sleeping to elongate the nipple areola complex to see if would help the upper lip of the baby lay flat.  LC noted when the baby was latched the upper lip was bunched up and did not effect latch. When she came off there was a red line where it had been bunched up. LC with parent permission checked for lip tie and the skin notch was above the gum line and upper lip stretches well.  LC reviewed BF D/C teaching and LC resources.  LC offered to request and LC O/P appt and parents receptive.  Parents aware they will receive a call from [facility] O/P /  Maternal Data Has patient been taught Hand Expression?: Yes Does the patient have breastfeeding experience prior to this delivery?: No  Feeding Mother's Current Feeding Choice: Breast Milk  LATCH Score Latch: Grasps breast easily, tongue down, lips flanged, rhythmical sucking.  Audible Swallowing: Spontaneous and intermittent  Type of Nipple: Everted at rest and after stimulation  Comfort (Breast/Nipple): Soft / non-tender  Hold (Positioning): Assistance needed to correctly position infant at breast and maintain latch.  LATCH Score: 9   Lactation Tools Discussed/Used    Interventions Interventions: Breast feeding basics reviewed;Assisted with latch;Skin to skin;Breast massage;Hand express;Reverse pressure;Breast compression;Adjust position;Support pillows;Position  options;Education  Discharge Discharge Education: Engorgement and breast care;Warning signs for feeding baby;Outpatient recommendation;Outpatient Epic message sent;Other (comment) Pump: DEBP;Manual;Personal  Consult Status Consult Status: Complete Date: 04/21/22    Kathrin Greathouse 04/21/2022, 11:55 AM

## 2022-04-27 ENCOUNTER — Telehealth (HOSPITAL_COMMUNITY): Payer: Self-pay

## 2022-04-27 NOTE — Telephone Encounter (Signed)
Patient did not answer phone call. Voicemail left for patient.   Marcelino Duster [facility] 04/27/2022,1619

## 2022-06-01 DIAGNOSIS — Z1332 Encounter for screening for maternal depression: Secondary | ICD-10-CM | POA: Diagnosis not present

## 2022-06-01 DIAGNOSIS — Z833 Family history of diabetes mellitus: Secondary | ICD-10-CM | POA: Diagnosis not present

## 2022-06-01 DIAGNOSIS — Z3009 Encounter for other general counseling and advice on contraception: Secondary | ICD-10-CM | POA: Diagnosis not present

## 2022-06-01 DIAGNOSIS — O24410 Gestational diabetes mellitus in pregnancy, diet controlled: Secondary | ICD-10-CM | POA: Diagnosis not present

## 2022-06-15 ENCOUNTER — Ambulatory Visit (INDEPENDENT_AMBULATORY_CARE_PROVIDER_SITE_OTHER): Payer: BC Managed Care – PPO

## 2022-06-15 DIAGNOSIS — Z23 Encounter for immunization: Secondary | ICD-10-CM | POA: Diagnosis not present

## 2022-06-25 ENCOUNTER — Encounter: Payer: Self-pay | Admitting: Family Medicine

## 2022-06-25 ENCOUNTER — Ambulatory Visit (INDEPENDENT_AMBULATORY_CARE_PROVIDER_SITE_OTHER): Payer: BC Managed Care – PPO | Admitting: Family Medicine

## 2022-06-25 VITALS — BP 100/70 | HR 70 | Temp 97.5°F | Ht 65.0 in | Wt 149.6 lb

## 2022-06-25 DIAGNOSIS — I809 Phlebitis and thrombophlebitis of unspecified site: Secondary | ICD-10-CM | POA: Diagnosis not present

## 2022-06-25 NOTE — Progress Notes (Signed)
   Established Patient Office Visit  Subjective   Patient ID: Bailey Reyes, female    DOB: 10/17/1990  Age: 31 y.o. MRN: [id number]  Chief Complaint  Patient presents with   Arm Injury    Patient complains of right arm knot, x1 day, Patient reports tenderness    HPI   Bailey Reyes is seen with a "knot "right volar surface of forearm which she just noticed yesterday.  She has a 63-week old daughter who is doing well.  She does not recall any kind of trauma.  Minimally tender.  She did get some blood work drawn a few weeks ago.  No history of DVT.  No right axillary adenopathy.  Feels well overall.  Past Medical History:  Diagnosis Date   ADD (attention deficit disorder)    dx in college  sx since grade school had ha with med     Anxiety    Exercise-induced asthma    emprici dx  given  inhaler prn no current use   Gestational diabetes    Past Surgical History:  Procedure Laterality Date   HARDWARE REMOVAL Left 12/11/2020   Procedure: REMOVAL HARDWARE FROM LEFT CLAVICLE;  Surgeon: Hiram Gash, MD;  Location: [place];  Service: Orthopedics;  Laterality: Left;   ORIF CLAVICULAR FRACTURE Left 06/05/2020   Procedure: OPEN REDUCTION INTERNAL FIXATION (ORIF) CLAVICULAR FRACTURE;  Surgeon: Hiram Gash, MD;  Location: [place];  Service: Orthopedics;  Laterality: Left;   TONSILLECTOMY AND ADENOIDECTOMY  2011    reports that she has never smoked. She has never used smokeless tobacco. She reports current alcohol use. She reports that she does not use drugs. family history includes Healthy in her father and mother; Rheum arthritis in her maternal grandmother. No Known Allergies  Review of Systems  Constitutional:  Negative for chills and fever.  Respiratory:  Negative for shortness of breath.   Cardiovascular:  Negative for chest pain.      Objective:     BP 100/70 (BP Location: Left Arm, Patient Position: Sitting, Cuff Size: Normal)   Pulse 70    Temp (!) 97.5 F (36.4 C) (Oral)   Ht 5\' 5"  (1.651 m)   Wt 149 lb 9.6 oz (67.9 kg)   SpO2 97%   BMI 24.89 kg/m    Physical Exam Vitals reviewed.  Constitutional:      Appearance: Normal appearance.  Musculoskeletal:     Comments: Forearm reveals on the volar surface about midway down approximately 3 x 3 mm localized area of phlebitis.  This is clearly along the course of the vein.  No overlying erythema.  No warmth.  No significant tenderness to palpation. No evidence for any edema involving the hand or wrist.  Neurological:     Mental Status: She is alert.      No results found for any visits on 06/25/22.    The ASCVD Risk score (Arnett DK, et al., 2019) failed to calculate for the following reasons:   The 2019 ASCVD risk score is only valid for ages 43 to 67    Assessment & Plan:   Localized area of phlebitis right forearm.  No evidence for DVT.  Reassurance.  Follow-up for any increased swelling or other concerns.  Could apply some topical heat  No follow-ups on file.    Carolann Littler, MD

## 2022-06-25 NOTE — Patient Instructions (Signed)
Follow up for any increased swelling or pain.    Suspect mild superficial phlebitis.   No evidence for DVT.
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
## 2022-08-15 DIAGNOSIS — Z20822 Contact with and (suspected) exposure to covid-19: Secondary | ICD-10-CM | POA: Diagnosis not present

## 2022-08-15 DIAGNOSIS — J029 Acute pharyngitis, unspecified: Secondary | ICD-10-CM | POA: Diagnosis not present

## 2022-08-17 IMAGING — RF DG C-ARM 1-60 MIN
1 series · 4 of 4 positions shown · non-contrast
Comparison: 05/30/2020

CLINICAL DATA: Clavicle fracture

EXAM:
LEFT CLAVICLE - 2+ VIEWS; DG C-ARM 1-60 MIN

[Series 1: run · 4 of 4 slices shown]
[im 1/4]
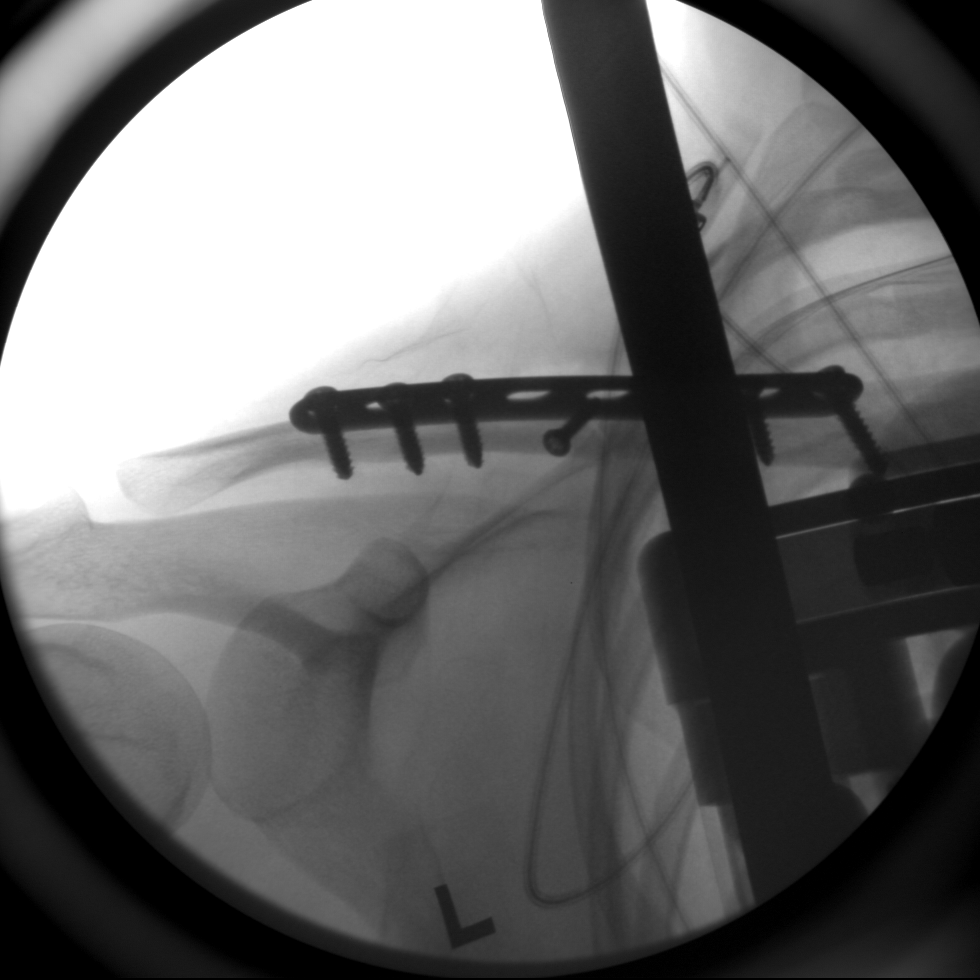
[im 2/4]
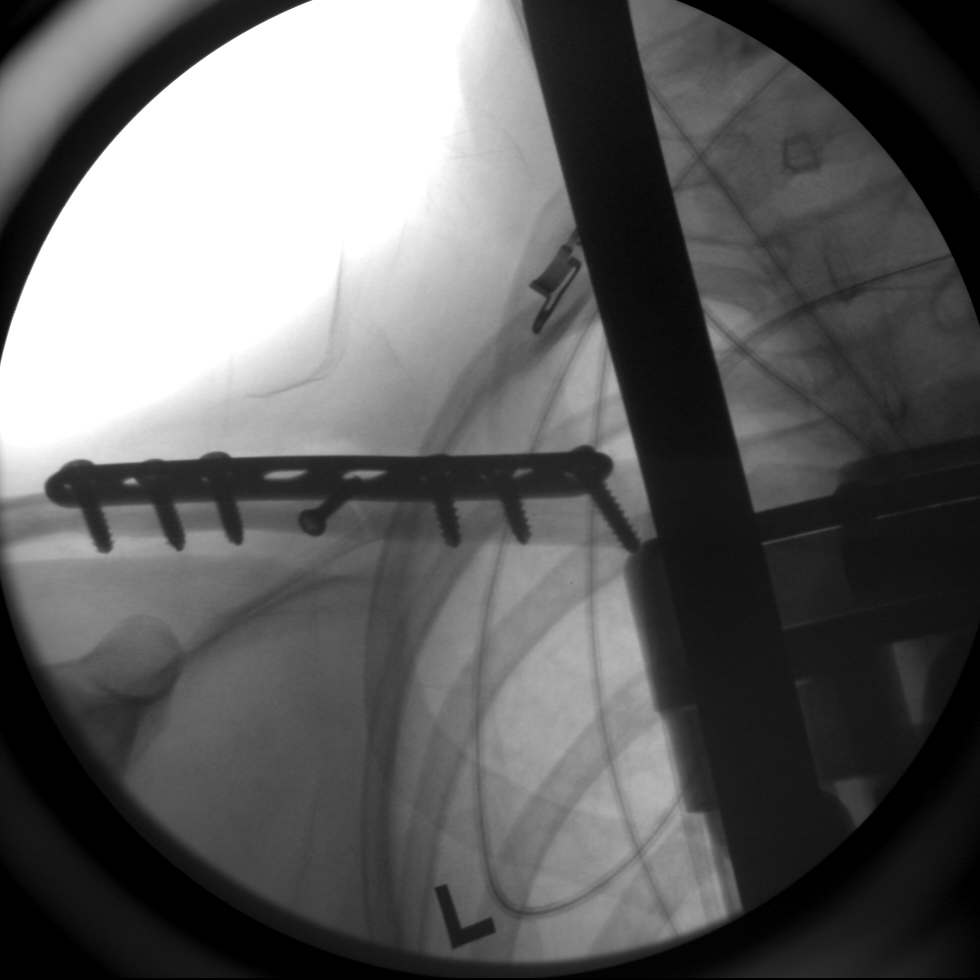
[im 3/4]
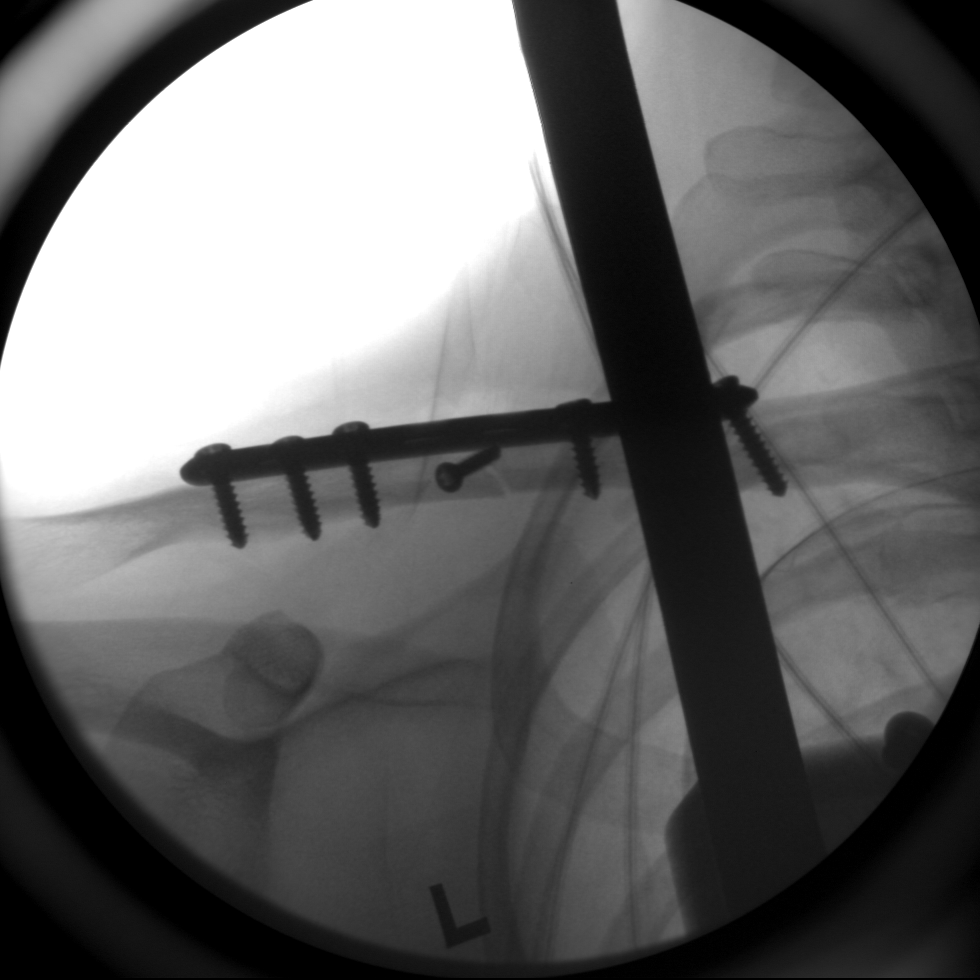
[im 4/4]
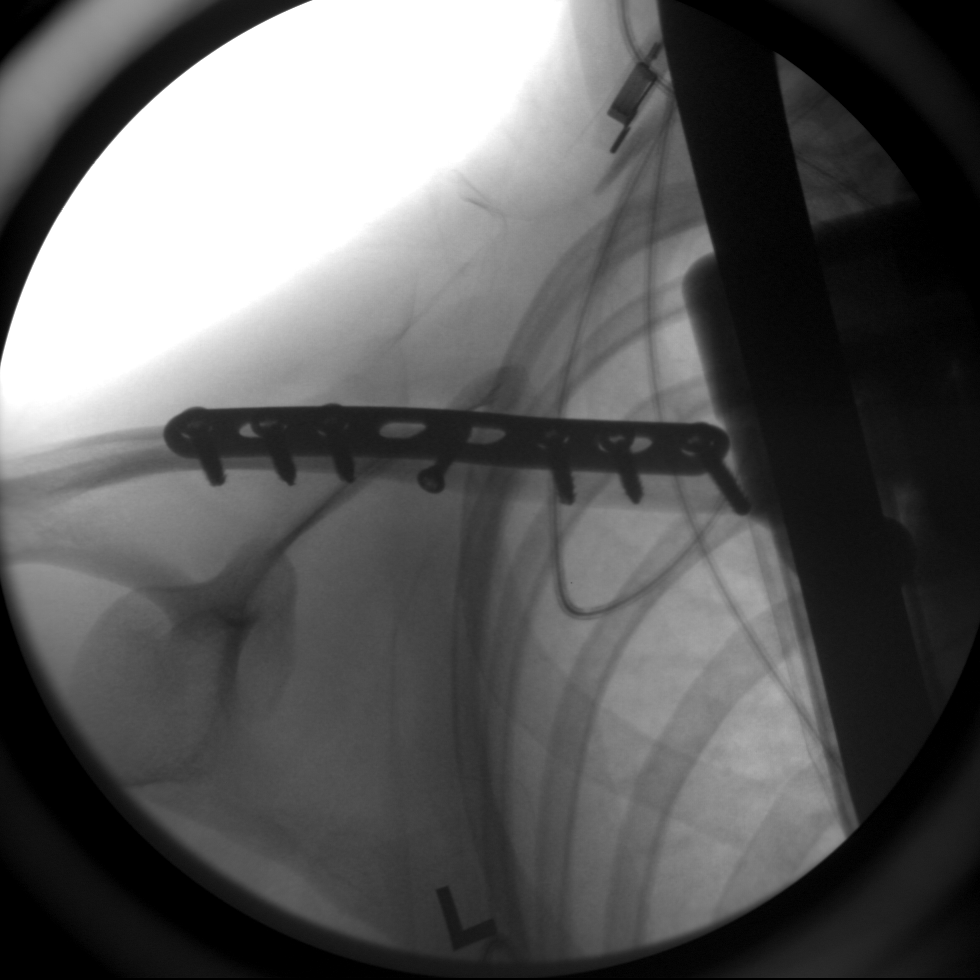

[4 of 4 positions shown; findings below may reference images not displayed]

FINDINGS: Four low resolution intraoperative spot views of the left clavicle.
Total fluoroscopy time was 13.1 seconds. The images demonstrate
surgical plate and multiple screw fixation of left clavicle for
midclavicular fracture. There is anatomic alignment
IMPRESSION: Intraoperative fluoroscopic assistance provided during surgical
fixation of left clavicle fracture.

## 2022-11-19 ENCOUNTER — Encounter: Payer: Self-pay | Admitting: Adult Health

## 2022-11-19 ENCOUNTER — Ambulatory Visit (INDEPENDENT_AMBULATORY_CARE_PROVIDER_SITE_OTHER): Payer: BC Managed Care – PPO | Admitting: Adult Health

## 2022-11-19 VITALS — BP 120/70 | HR 74 | Temp 98.4°F | Ht 65.0 in | Wt 149.0 lb

## 2022-11-19 DIAGNOSIS — M7660 Achilles tendinitis, unspecified leg: Secondary | ICD-10-CM

## 2022-11-19 NOTE — Patient Instructions (Addendum)
It was great meeting you today   I think you have achilles tendonitis.   I recommend doing the following   - Avoid aggravating activities  - Apply ice  for 15-20 minutes throughout the day   - Take a short course (7 to 10 days) of nonsteroidal antiinflammatory drugs (NSAIDs) such as Motrin or Aleve   - Support the Achilles with a heel lift

## 2022-11-19 NOTE — Progress Notes (Signed)
Subjective:    Patient ID: Bailey Reyes, female    DOB: 04/17/1991, 32 y.o.   MRN: [id number]  HPI  32 year old female who  has a past medical history of ADD (attention deficit disorder), Anxiety, Exercise-induced asthma, and Gestational diabetes.  She presents to the office today for an acute issue.  She reports that over the last 7 months she has been using her right foot to rock her baby's crib.  Over the last week her right Achilles tendon has become painful.  Pain is worse when walking which causes her to limp.  She did try icing it for a few days but did not find this helpful.  Has not been taking any over-the-counter medications.  She denies any trauma or aggravating injury  Review of Systems See HPI   Past Medical History:  Diagnosis Date   ADD (attention deficit disorder)    dx in college  sx since grade school had ha with med     Anxiety    Exercise-induced asthma    emprici dx  given  inhaler prn no current use   Gestational diabetes     Social History   Socioeconomic History   Marital status: Married    Spouse name: Not on file   Number of children: Not on file   Years of education: Not on file   Highest education level: Not on file  Occupational History   Not on file  Tobacco Use   Smoking status: Never   Smokeless tobacco: Never  Vaping Use   Vaping Use: Never used  Substance and Sexual Activity   Alcohol use: Yes    Alcohol/week: 0.0 standard drinks of alcohol    Comment: average 2 drinks weekly   Drug use: No   Sexual activity: Not on file  Other Topics Concern   Not on file  Social History Narrative   Not on file   Social Determinants of Health   Financial Resource Strain: Not on file  Food Insecurity: No Food Insecurity (02/08/2022)   Hunger Vital Sign    Worried About Running Out of Food in the Last Year: Never true    Ran Out of Food in the Last Year: Never true  Transportation Needs: Not on file  Physical Activity: Not on file   Stress: Not on file  Social Connections: Not on file  Intimate Partner Violence: Not on file    Past Surgical History:  Procedure Laterality Date   HARDWARE REMOVAL Left 12/11/2020   Procedure: [place];  Surgeon: Hiram Gash, MD;  Location: [place];  Service: Orthopedics;  Laterality: Left;   ORIF CLAVICULAR FRACTURE Left 06/05/2020   Procedure: OPEN REDUCTION INTERNAL FIXATION (ORIF) CLAVICULAR FRACTURE;  Surgeon: Hiram Gash, MD;  Location: [place];  Service: Orthopedics;  Laterality: Left;   TONSILLECTOMY AND ADENOIDECTOMY  2011    Family History  Problem Relation Age of Onset   Rheum arthritis Maternal Grandmother        on    Healthy Mother    Healthy Father     No Known Allergies  Current Outpatient Medications on File Prior to Visit  Medication Sig Dispense Refill   albuterol (PROAIR HFA) 108 (90 Base) MCG/ACT inhaler Inhale 2 puffs into the lungs every 6 (six) hours as needed for wheezing or shortness of breath. 8.5 g 0   Prenatal Vit-Fe Fumarate-FA (PRENATAL MULTIVITAMIN) TABS tablet Take 1 tablet by mouth  daily at 12 noon.     No current facility-administered medications on file prior to visit.    BP 120/70   Pulse 74   Temp 98.4 F (36.9 C) (Oral)   Ht 5\' 5"  (1.651 m)   Wt 149 lb (67.6 kg)   SpO2 98%   BMI 24.79 kg/m       Objective:   Physical Exam Vitals and nursing note reviewed.  Constitutional:      Appearance: Normal appearance.  Musculoskeletal:     Right ankle:     Right Achilles Tendon: Tenderness present. No defects. Thompson's test negative.     Comments: There was tenderness with palpation to the right Achilles tendon about 4 cm proximal to the insertion of the tendon.  But no thickening, bruising, swelling,, redness, warmth or defect noted   Neurological:     Mental Status: She is alert.        Assessment & Plan:  1. Non-insertional Achilles tendinopathy No signs of  rupture or calcaneal bursitis.   I recommend doing the following  - Avoid aggravating activities - Apply ice  for 15-20 minutes throughout the day  - Take a short course (7 to 10 days) of nonsteroidal antiinflammatory drugs (NSAIDs) such as Motrin or Aleve  - Support the Achilles with a heel lift  - Follow up if not improving in the next week to 10 days   Dorothyann Peng, NP

## 2023-02-08 IMAGING — CT CT SHOULDER*L* W/O CM
3 series · 10 of 33 positions shown, 12 images · non-contrast
Comparison: Left clavicle x-rays dated June 05, 2020.

CLINICAL DATA: Left clavicle pain. Prior fracture with ORIF last
[REDACTED].

EXAM:
CT OF THE UPPER LEFT EXTREMITY WITHOUT CONTRAST
TECHNIQUE: Multidetector CT imaging of the left shoulder was performed
according to the standard protocol.

[Series 2: shoulder 2.00 br40 s3 axial soft (person_name) · axial · 0.39mm/px · z∈[-887,-837]mm · 2 of 55 slices shown, 3 images]
[im 17/55  soft-tissue]
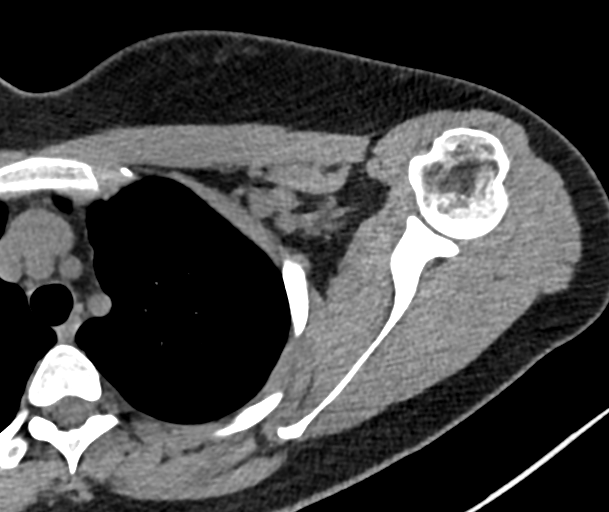
[im 17/55  bone]
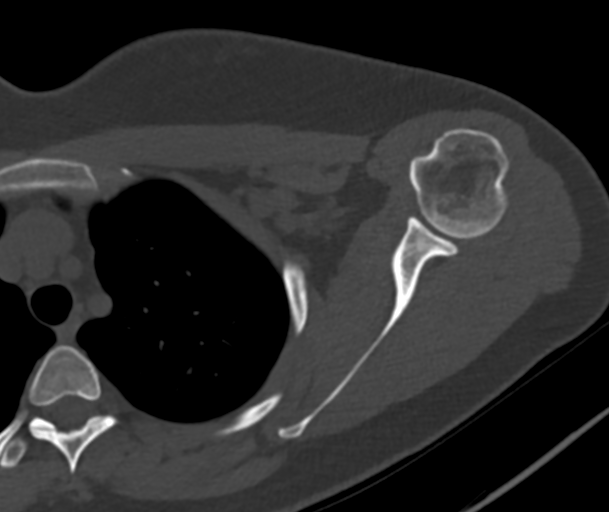
[im 42/55  bone]
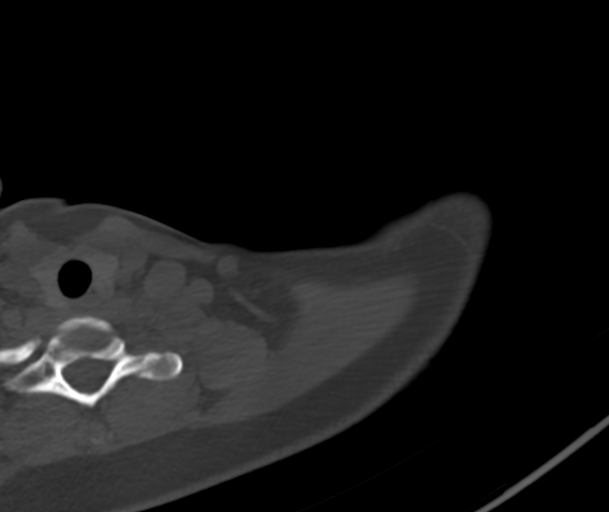

[Series 6: shoulder 2.00 br40 s3 cor soft (person_name) · coronal · 0.22mm/px · 3 of 99 slices shown]
[im 20/99  bone]
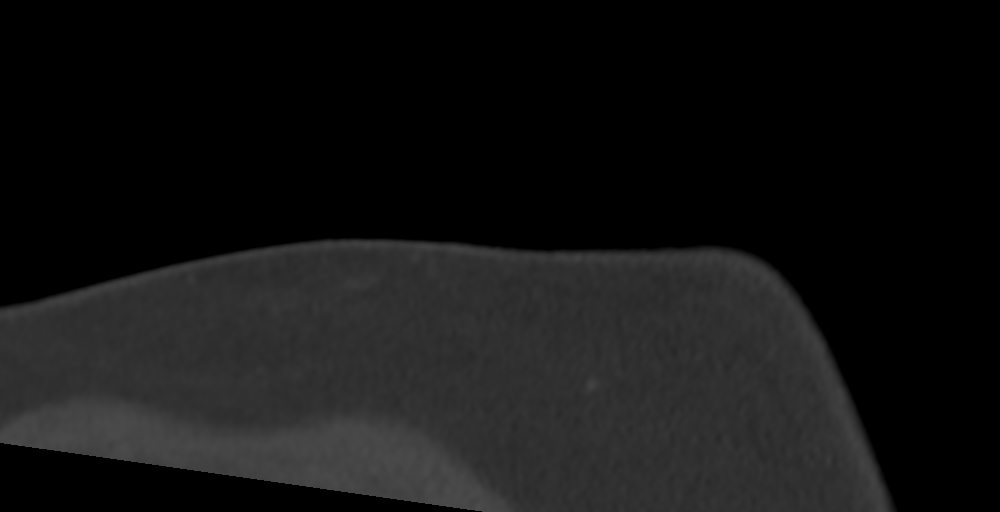
[im 40/99  bone]
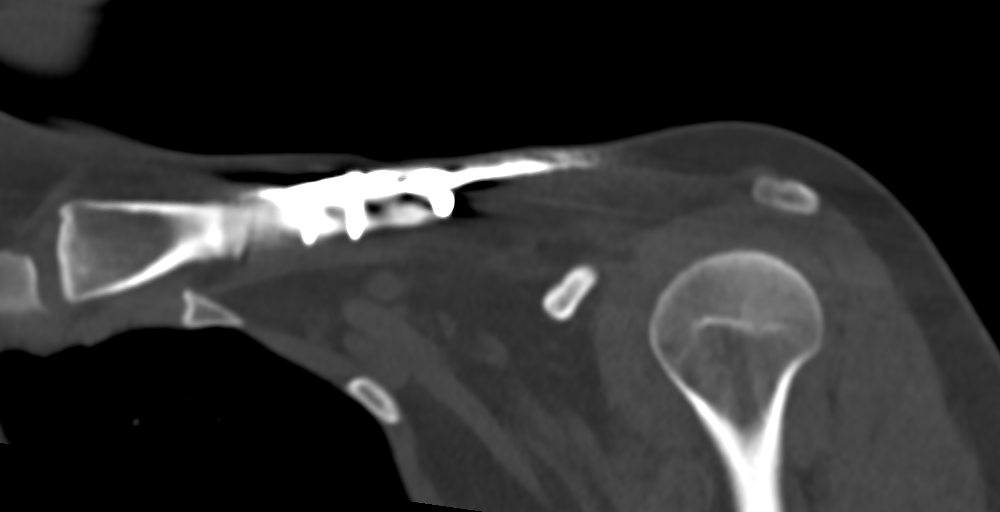
[im 59/99  bone]
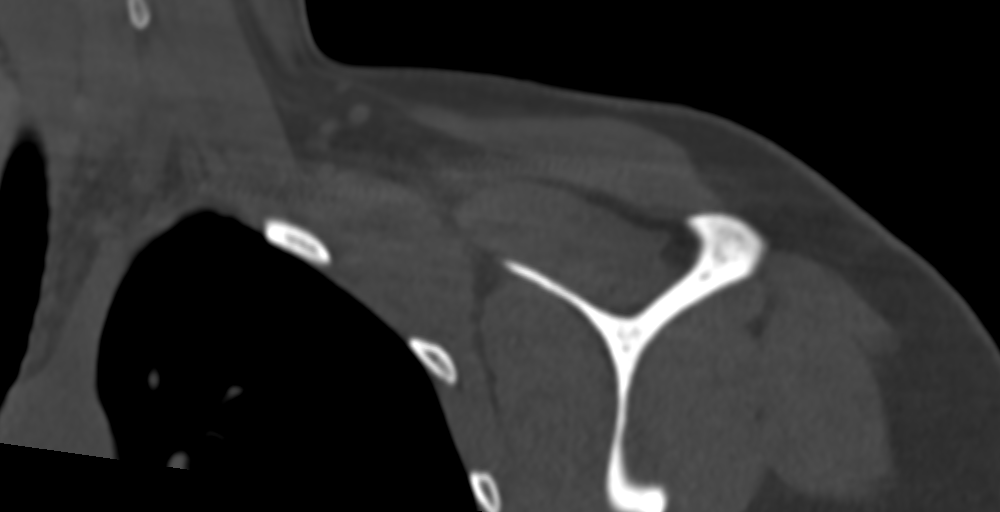

[Series 10: shoulder 2.00 br40 s3 sag soft (person_name) · sagittal · 0.22mm/px · 5 of 108 slices shown, 6 images]
[im 36/108  bone]
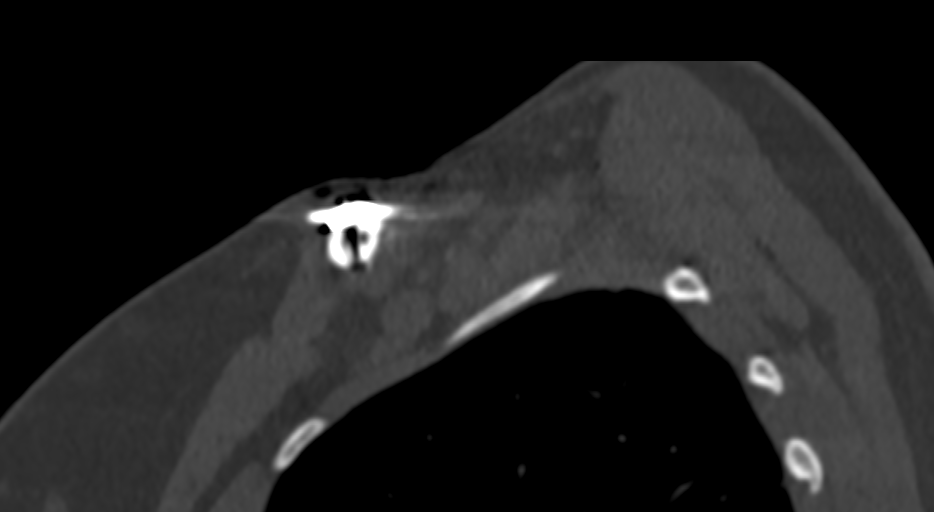
[im 45/108  bone]
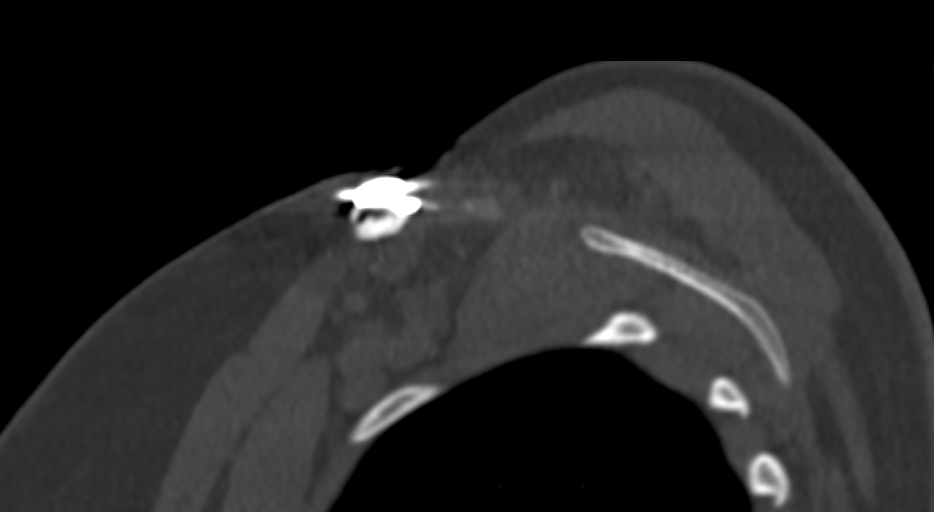
[im 54/108  soft-tissue]
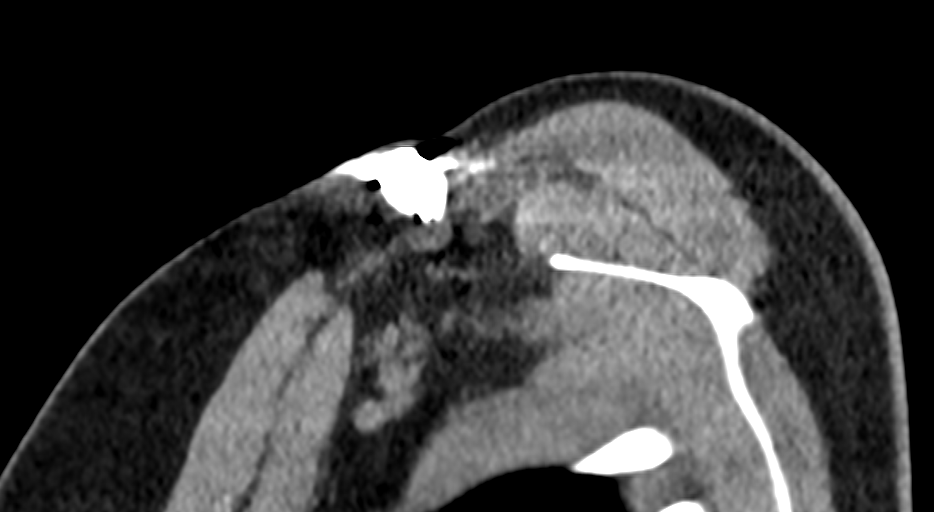
[im 54/108  bone]
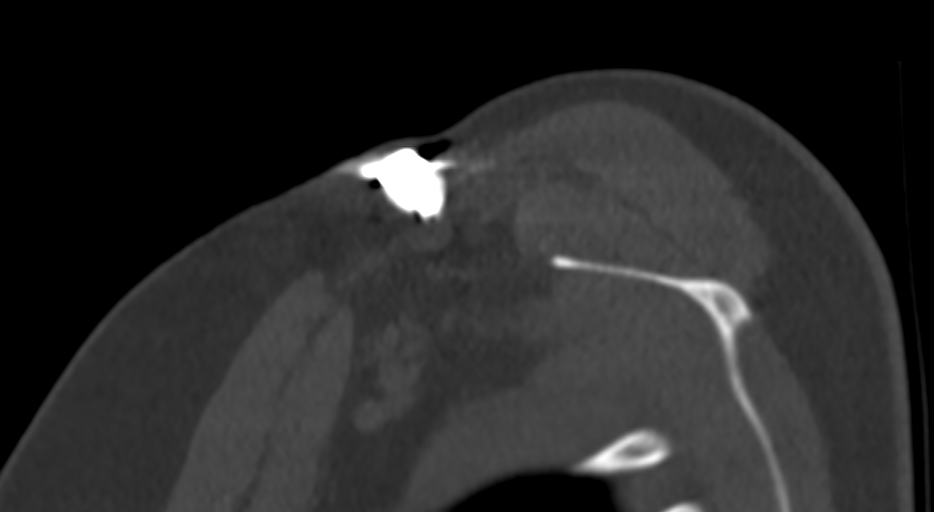
[im 63/108  bone]
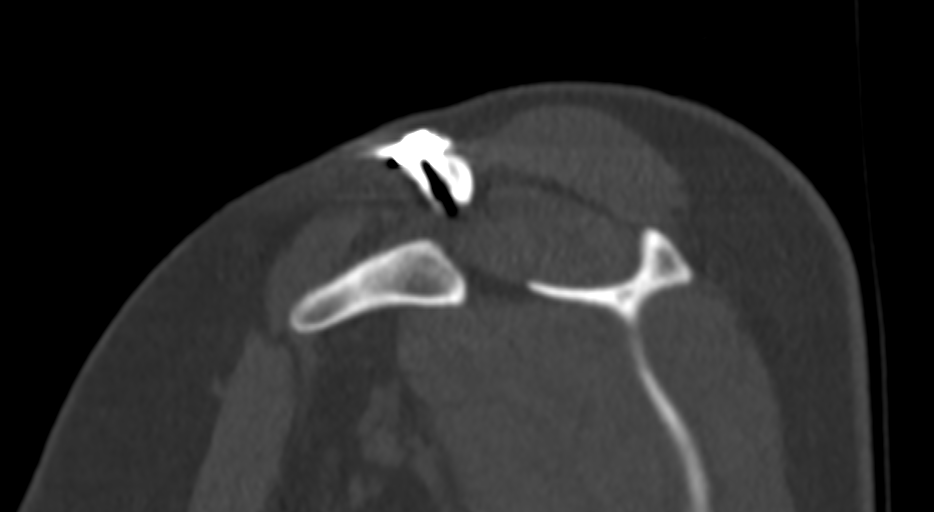
[im 72/108  bone]
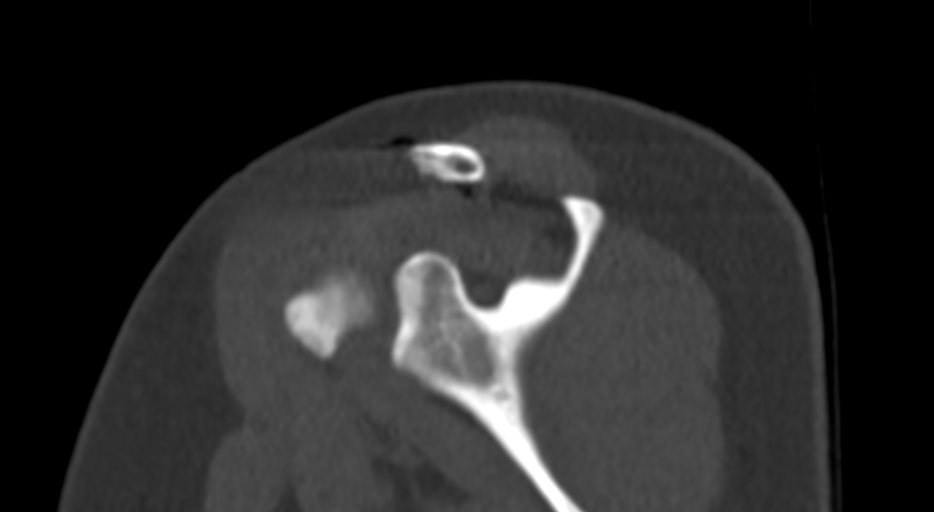

[10 of 33 positions shown; findings below may reference images not displayed]

FINDINGS: Bones/Joint/Cartilage

Healed left mid clavicle fracture status post ORIF. No evidence of
hardware failure or loosening. No acute fracture or dislocation.
Joint spaces are preserved. No joint effusion.

Ligaments

Ligaments are suboptimally evaluated by CT.

Muscles and Tendons
Grossly intact.  No muscle atrophy.

Soft tissue
No fluid collection or hematoma.  No soft tissue mass.
IMPRESSION: 1. Healed left clavicle fracture status post ORIF. No evidence of
hardware complication.

## 2023-04-21 DIAGNOSIS — Z124 Encounter for screening for malignant neoplasm of cervix: Secondary | ICD-10-CM | POA: Diagnosis not present

## 2023-04-21 DIAGNOSIS — Z01419 Encounter for gynecological examination (general) (routine) without abnormal findings: Secondary | ICD-10-CM | POA: Diagnosis not present

## 2023-04-21 DIAGNOSIS — Z1151 Encounter for screening for human papillomavirus (HPV): Secondary | ICD-10-CM | POA: Diagnosis not present

## 2023-04-22 ENCOUNTER — Encounter: Payer: Self-pay | Admitting: Internal Medicine

## 2023-10-24 DIAGNOSIS — Z3201 Encounter for pregnancy test, result positive: Secondary | ICD-10-CM | POA: Diagnosis not present

## 2023-11-08 DIAGNOSIS — O200 Threatened abortion: Secondary | ICD-10-CM | POA: Diagnosis not present

## 2023-11-08 DIAGNOSIS — O021 Missed abortion: Secondary | ICD-10-CM | POA: Diagnosis not present

## 2023-11-17 DIAGNOSIS — O021 Missed abortion: Secondary | ICD-10-CM | POA: Diagnosis not present

## 2023-11-17 DIAGNOSIS — Z3A01 Less than 8 weeks gestation of pregnancy: Secondary | ICD-10-CM | POA: Diagnosis not present

## 2023-11-17 DIAGNOSIS — O3680X1 Pregnancy with inconclusive fetal viability, fetus 1: Secondary | ICD-10-CM | POA: Diagnosis not present

## 2023-11-24 DIAGNOSIS — O039 Complete or unspecified spontaneous abortion without complication: Secondary | ICD-10-CM | POA: Diagnosis not present

## 2023-11-24 DIAGNOSIS — O034 Incomplete spontaneous abortion without complication: Secondary | ICD-10-CM | POA: Diagnosis not present

## 2023-12-08 DIAGNOSIS — O034 Incomplete spontaneous abortion without complication: Secondary | ICD-10-CM | POA: Diagnosis not present

## 2023-12-22 DIAGNOSIS — O034 Incomplete spontaneous abortion without complication: Secondary | ICD-10-CM | POA: Diagnosis not present

## 2024-04-25 ENCOUNTER — Ambulatory Visit (INDEPENDENT_AMBULATORY_CARE_PROVIDER_SITE_OTHER): Admitting: Family Medicine

## 2024-04-25 ENCOUNTER — Ambulatory Visit: Payer: Self-pay

## 2024-04-25 ENCOUNTER — Encounter: Payer: Self-pay | Admitting: Family Medicine

## 2024-04-25 VITALS — BP 110/62 | HR 81 | Temp 98.2°F | Ht 65.0 in | Wt 149.0 lb

## 2024-04-25 DIAGNOSIS — W57XXXA Bitten or stung by nonvenomous insect and other nonvenomous arthropods, initial encounter: Secondary | ICD-10-CM | POA: Diagnosis not present

## 2024-04-25 DIAGNOSIS — S40861A Insect bite (nonvenomous) of right upper arm, initial encounter: Secondary | ICD-10-CM | POA: Diagnosis not present

## 2024-04-25 MED ORDER — DOXYCYCLINE HYCLATE 100 MG PO TABS: 100.0000 mg | ORAL_TABLET | Freq: Two times a day (BID) | ORAL | 0 refills | Status: AC

## 2024-04-25 NOTE — Patient Instructions (Signed)
-  It was a pleasure to care for you today.  -Prescribed Doxycycline  100mg  tablet, take 1 tablet twice a day for 14 days for infected insect bite. More likely got bit by an insect and the skin around the bite got infected.  -Recommend to keep area cleansed with mild soap twice a day and pat dry.  -May use cool compresses on the area 4-6 times a day, up to 20 minutes at a time to help with swelling. -Discussed reasons for prompt return or if needed to go to the emergency department.

## 2024-04-25 NOTE — Telephone Encounter (Signed)
 FYI Only or Action Required?: Action required by provider: request for appointment.  Patient was last seen in primary care on 11/19/2022 by Merna Huxley, NP.  Called Nurse Triage reporting Insect Bite.  Symptoms began several days ago.  Interventions attempted: Nothing.  Symptoms are: gradually worsening. Area to arm has red ring and center has drainage.  Triage Disposition: See Physician Within 24 Hours  Patient/caregiver understands and will follow disposition?: Yes   Copied from CRM [id number]. Topic: Clinical - Red Word Triage >> Apr 25, 2024  8:39 AM Thersia BROCKS wrote: [facility] that prompted transfer to Nurse Triage: Patient called in stated she got Bit a couple of days ago though it was a mosquito bite but she doesn't think so anymore has alil rash around it as well , alil swollen Reason for Disposition  [1] Red or very tender (to touch) area AND [2] getting larger over 48 hours after the bite  Answer Assessment - Initial Assessment Questions 1. TYPE of INSECT: What type of insect was it?      Unsure 2. ONSET: When did you get bitten?      Saturday 3. LOCATION: Where is the insect bite located?      Arm , right upper 4. REDNESS: Is the area red or pink? If Yes, ask: What size is the area of redness? (inches or cm). When did the redness start?     Red, center is draining 5. PAIN: Is there any pain? If Yes, ask: How bad is the pain? (Scale 0-10; or none, mild, moderate, severe)     yes 6. ITCHING: Does it itch? If Yes, ask: How bad is the itch?      yes 7. SWELLING: How big is the swelling? (e.g., inches, cm, or compare to coins)     yes 8. OTHER SYMPTOMS: Do you have any other symptoms?  (e.g., difficulty breathing, fever, hives)     no 9. PREGNANCY: Is there any chance you are pregnant? When was your last menstrual period?     no  Protocols used: Insect Bite-A-AH

## 2024-04-25 NOTE — Progress Notes (Signed)
   Acute Office Visit   Subjective:  Patient ID: Bailey Reyes, female    DOB: 07-Jul-1991, 33 y.o.   MRN: [id number]  Chief Complaint  Patient presents with   Insect Bite    HPI Patient is here for an acute visit. Primary care provider is Dr. MICAEL [facility].   She is complaining of insect bite on right upper arm. She reports she was [place] golf [place] on Saturday and thinks she got bit by something. On Sunday, the area started itching, becoming more inflamed and red. Then, she has noticed some clear discharge on either Sunday or Monday. Not present today. Denies fever. But, reports she did get nausea last night. Does not complain of nausea today.   ROS See HPI above      Objective:   BP 110/62   Pulse 81   Temp 98.2 F (36.8 C) (Oral)   Ht 5' 5 (1.651 m)   Wt 149 lb (67.6 kg)   LMP  (LMP Unknown)   SpO2 97%   BMI 24.79 kg/m    Physical Exam Vitals reviewed.  Constitutional:      General: She is not in acute distress.    Appearance: Normal appearance. She is not ill-appearing, toxic-appearing or diaphoretic.  HENT:     Head: Normocephalic and atraumatic.  Eyes:     General:        Right eye: No discharge.        Left eye: No discharge.     Conjunctiva/sclera: Conjunctivae normal.  Cardiovascular:     Rate and Rhythm: Normal rate.  Pulmonary:     Effort: Pulmonary effort is normal. No respiratory distress.  Musculoskeletal:        General: Normal range of motion.  Skin:    General: Skin is warm and dry.     Comments: See picture included of right upper arm.   Neurological:     General: No focal deficit present.     Mental Status: She is alert and oriented to person, place, and time. Mental status is at baseline.     Motor: No weakness.     Gait: Gait normal.  Psychiatric:        Mood and Affect: Mood normal.        Behavior: Behavior normal.        Thought Content: Thought content normal.        Judgment: Judgment normal.          Assessment & Plan:   Bug bite with infection, initial encounter -     Doxycycline  Hyclate; Take 1 tablet (100 mg total) by mouth 2 (two) times daily for 14 days.  Dispense: 28 tablet; Refill: 0  -Prescribed Doxycycline  100mg  tablet, take 1 tablet twice a day for 14 days for infected insect bite. More likely got bit by an insect and the skin around the bite got infected.  -Recommend to keep area cleansed with mild soap twice a day and pat dry.  -May use cool compresses on the area 4-6 times a day, up to 20 minutes at a time to help with swelling. -Discussed reasons for prompt return or if needed to go to the emergency department.   Kacy Hegna, NP

## 2024-07-10 ENCOUNTER — Encounter: Payer: Self-pay | Admitting: Family Medicine

## 2024-07-10 ENCOUNTER — Ambulatory Visit: Admitting: Family Medicine

## 2024-07-10 VITALS — BP 108/72 | HR 83 | Temp 97.5°F | Wt 149.0 lb

## 2024-07-10 DIAGNOSIS — B349 Viral infection, unspecified: Secondary | ICD-10-CM

## 2024-07-10 DIAGNOSIS — R059 Cough, unspecified: Secondary | ICD-10-CM | POA: Diagnosis not present

## 2024-07-10 DIAGNOSIS — J029 Acute pharyngitis, unspecified: Secondary | ICD-10-CM | POA: Diagnosis not present

## 2024-07-10 LAB — POCT INFLUENZA A/B
Influenza A, POC: NEGATIVE
Influenza B, POC: NEGATIVE

## 2024-07-10 LAB — POC COVID19 BINAXNOW: SARS Coronavirus 2 Ag: NEGATIVE

## 2024-07-10 LAB — POCT RAPID STREP A (OFFICE): Rapid Strep A Screen: NEGATIVE

## 2024-07-10 NOTE — Addendum Note (Signed)
 Addended by: LADONNA INOCENTE SAILOR on: 07/10/2024 11:26 AM   Modules accepted: Orders

## 2024-07-10 NOTE — Progress Notes (Signed)
   Subjective:    Patient ID: Bailey Reyes, female    DOB: March 25, 1991, 33 y.o.   MRN: [id number]  HPI Here for 4 days of body aches, headache, fevers, ST, lightheadedness, and dry cough. Over the past 2 days she has improved quite a bit. The fever and body aches are gone. She still has a mild ST and dry cough. No SOB. She is drinking fluids, but she has not taken any medication for this because she and her husband are trying to get pregnant again.    Review of Systems  Constitutional:  Positive for fatigue.  HENT:  Positive for congestion, postnasal drip, sinus pressure and sore throat. Negative for ear pain, trouble swallowing and voice change.   Eyes: Negative.   Respiratory:  Positive for cough. Negative for shortness of breath and wheezing.   Gastrointestinal: Negative.        Objective:   Physical Exam Constitutional:      Appearance: Normal appearance. She is not ill-appearing.  HENT:     Right Ear: Tympanic membrane, ear canal and external ear normal.     Left Ear: Tympanic membrane, ear canal and external ear normal.     Nose: Nose normal.     Mouth/Throat:     Pharynx: Oropharynx is clear.  Eyes:     Conjunctiva/sclera: Conjunctivae normal.  Pulmonary:     Effort: Pulmonary effort is normal.     Breath sounds: Normal breath sounds.  Lymphadenopathy:     Cervical: No cervical adenopathy.  Neurological:     Mental Status: She is alert.           Assessment & Plan:  Viral illness. She seems to be getting over this. She will follow up as needed. Garnette Olmsted, MD

## 2024-07-31 DIAGNOSIS — N920 Excessive and frequent menstruation with regular cycle: Secondary | ICD-10-CM | POA: Diagnosis not present

## 2024-07-31 DIAGNOSIS — Z01419 Encounter for gynecological examination (general) (routine) without abnormal findings: Secondary | ICD-10-CM | POA: Diagnosis not present

## 2024-08-03 DIAGNOSIS — N920 Excessive and frequent menstruation with regular cycle: Secondary | ICD-10-CM | POA: Diagnosis not present

## 2024-08-13 DIAGNOSIS — N920 Excessive and frequent menstruation with regular cycle: Secondary | ICD-10-CM | POA: Diagnosis not present
